# Patient Record
Sex: Female | Born: 1960 | Race: Black or African American | Hispanic: No | Marital: Single | Smoking: Never smoker
Health system: Southern US, Community
[De-identification: ages and names within clinical notes are randomized; demographics above are authoritative.]

## PROBLEM LIST (undated history)

## (undated) DIAGNOSIS — N289 Disorder of kidney and ureter, unspecified: Secondary | ICD-10-CM

---

## 2000-12-25 ENCOUNTER — Emergency Department (HOSPITAL_COMMUNITY): Admission: EM | Admit: 2000-12-25 | Discharge: 2000-12-25 | Payer: Self-pay

## 2001-11-25 ENCOUNTER — Emergency Department (HOSPITAL_COMMUNITY): Admission: EM | Admit: 2001-11-25 | Discharge: 2001-11-25 | Payer: Self-pay | Admitting: Emergency Medicine

## 2001-12-01 ENCOUNTER — Emergency Department (HOSPITAL_COMMUNITY): Admission: EM | Admit: 2001-12-01 | Discharge: 2001-12-01 | Payer: Self-pay | Admitting: Emergency Medicine

## 2001-12-02 ENCOUNTER — Encounter: Payer: Self-pay | Admitting: Emergency Medicine

## 2002-04-18 ENCOUNTER — Emergency Department (HOSPITAL_COMMUNITY): Admission: EM | Admit: 2002-04-18 | Discharge: 2002-04-18 | Payer: Self-pay | Admitting: Emergency Medicine

## 2002-11-22 ENCOUNTER — Encounter: Payer: Self-pay | Admitting: Emergency Medicine

## 2002-11-22 ENCOUNTER — Emergency Department (HOSPITAL_COMMUNITY): Admission: EM | Admit: 2002-11-22 | Discharge: 2002-11-22 | Payer: Self-pay | Admitting: *Deleted

## 2003-08-25 ENCOUNTER — Emergency Department (HOSPITAL_COMMUNITY): Admission: EM | Admit: 2003-08-25 | Discharge: 2003-08-26 | Payer: Self-pay | Admitting: Emergency Medicine

## 2008-11-26 ENCOUNTER — Ambulatory Visit (HOSPITAL_COMMUNITY): Admission: EM | Admit: 2008-11-26 | Discharge: 2008-11-26 | Payer: Self-pay | Admitting: Emergency Medicine

## 2009-01-18 ENCOUNTER — Emergency Department (HOSPITAL_COMMUNITY): Admission: EM | Admit: 2009-01-18 | Discharge: 2009-01-19 | Payer: Self-pay | Admitting: *Deleted

## 2010-11-26 LAB — COMPREHENSIVE METABOLIC PANEL
AST: 26 U/L (ref 0–37)
Albumin: 3.7 g/dL (ref 3.5–5.2)
Alkaline Phosphatase: 71 U/L (ref 39–117)
BUN: 7 mg/dL (ref 6–23)
Chloride: 103 mEq/L (ref 96–112)
Creatinine, Ser: 0.65 mg/dL (ref 0.4–1.2)
GFR calc Af Amer: >60 mL/min (ref >60)
Potassium: 3.5 mEq/L (ref 3.5–5.1)
Total Bilirubin: 1 mg/dL (ref 0.3–1.2)
Total Protein: 7.2 g/dL (ref 6.0–8.3)

## 2010-11-26 LAB — DIFFERENTIAL
Basophils Absolute: 0 10*3/uL (ref 0.0–0.1)
Eosinophils Relative: 1 % (ref 0–5)
Lymphocytes Relative: 5 % — ABNORMAL LOW (ref 12–46)
Monocytes Absolute: 0.4 10*3/uL (ref 0.1–1.0)
Monocytes Relative: 5 % (ref 3–12)
Neutro Abs: 7.3 10*3/uL (ref 1.7–7.7)

## 2010-11-26 LAB — URINALYSIS, ROUTINE W REFLEX MICROSCOPIC
Leukocytes, UA: NEGATIVE
Nitrite: NEGATIVE
Specific Gravity, Urine: 1.023 (ref 1.005–1.030)
Urobilinogen, UA: 1 mg/dL (ref 0.0–1.0)
pH: 6 (ref 5.0–8.0)

## 2010-11-26 LAB — CBC
MCHC: 33.6 g/dL (ref 30.0–36.0)
MCV: 88.3 fL (ref 78.0–100.0)
RBC: 4.35 MIL/uL (ref 3.87–5.11)
RDW: 12.1 % (ref 11.5–15.5)

## 2010-11-26 LAB — URINE MICROSCOPIC-ADD ON

## 2010-11-28 LAB — DIFFERENTIAL
Basophils Absolute: 0.1 10*3/uL (ref 0.0–0.1)
Basophils Relative: 1 % (ref 0–1)
Lymphocytes Relative: 28 % (ref 12–46)
Monocytes Absolute: 0.6 10*3/uL (ref 0.1–1.0)
Neutro Abs: 5.9 10*3/uL (ref 1.7–7.7)
Neutrophils Relative %: 64 % (ref 43–77)

## 2010-11-28 LAB — CBC
HCT: 36.8 % (ref 36.0–46.0)
MCHC: 33.3 g/dL (ref 30.0–36.0)
MCV: 87.2 fL (ref 78.0–100.0)
Platelets: 267 10*3/uL (ref 150–400)
RBC: 4.22 MIL/uL (ref 3.87–5.11)
RDW: 11.7 % (ref 11.5–15.5)
WBC: 8.9 10*3/uL (ref 4.0–10.5)

## 2010-11-28 LAB — URINALYSIS, ROUTINE W REFLEX MICROSCOPIC
Bilirubin Urine: NEGATIVE
Glucose, UA: NEGATIVE mg/dL
Hgb urine dipstick: NEGATIVE
Specific Gravity, Urine: 1.015 (ref 1.005–1.030)

## 2010-11-28 LAB — BASIC METABOLIC PANEL
BUN: 5 mg/dL — ABNORMAL LOW (ref 6–23)
CO2: 25 mEq/L (ref 19–32)
Calcium: 8.9 mg/dL (ref 8.4–10.5)
Chloride: 105 mEq/L (ref 96–112)
Creatinine, Ser: 0.51 mg/dL (ref 0.4–1.2)

## 2010-11-28 LAB — LIPID PANEL
Total CHOL/HDL Ratio: 3.8 RATIO
VLDL: 11 mg/dL (ref 0–40)

## 2010-11-28 LAB — RAPID URINE DRUG SCREEN, HOSP PERFORMED
Barbiturates: NOT DETECTED
Benzodiazepines: NOT DETECTED
Cocaine: NOT DETECTED
Opiates: NOT DETECTED

## 2010-11-28 LAB — POCT CARDIAC MARKERS
CKMB, poc: <1 ng/mL — ABNORMAL LOW (ref 1.0–8.0)
Myoglobin, poc: 50.4 ng/mL (ref 12–200)
Troponin i, poc: <0.05 ng/mL (ref 0.00–0.09)

## 2010-11-28 LAB — D-DIMER, QUANTITATIVE: D-Dimer, Quant: 0.29 ug/mL-FEU (ref 0.00–0.48)

## 2010-11-28 LAB — URINE CULTURE
Colony Count: NO GROWTH
Culture: NO GROWTH

## 2010-11-28 LAB — POCT I-STAT, CHEM 8
BUN: 7 mg/dL (ref 6–23)
Chloride: 104 mEq/L (ref 96–112)
Glucose, Bld: 96 mg/dL (ref 70–99)
HCT: 43 % (ref 36.0–46.0)
Potassium: 3.8 mEq/L (ref 3.5–5.1)

## 2010-11-28 LAB — PROTIME-INR
INR: 1 (ref 0.00–1.49)
Prothrombin Time: 13.2 seconds (ref 11.6–15.2)

## 2010-11-28 LAB — TSH: TSH: 2.23 u[IU]/mL (ref 0.350–4.500)

## 2010-11-28 LAB — GLUCOSE, CAPILLARY

## 2010-11-28 LAB — POCT PREGNANCY, URINE: Preg Test, Ur: NEGATIVE

## 2010-11-28 LAB — APTT: aPTT: 34 seconds (ref 24–37)

## 2010-11-28 LAB — CARDIAC PANEL(CRET KIN+CKTOT+MB+TROPI): CK, MB: 0.7 ng/mL (ref 0.3–4.0)

## 2011-01-01 NOTE — H&P (Signed)
NAMEASENETH, HACK               ACCOUNT NO.:  [id number]   MEDICAL RECORD NO.:  [id number]          PATIENT TYPE:  INP   LOCATION:  1403                         FACILITY:  [facility]   PHYSICIAN:  Sarajane Marek, MD     DATE OF BIRTH:  March 17, 1961   DATE OF ADMISSION:  11/25/2008  DATE OF DISCHARGE:  11/26/2008                              HISTORY & PHYSICAL   CHIEF COMPLAINT:  Chest pain.   Ms. Wallman is a 50 year old African American female with no significant  past medical history and no cardiovascular risk factors who presents  with chest pain.  She reports she has had chest tightness with the sense  of not being able to get a full breath for 24 hours.  This started  yesterday while she was running and caused her to have to stop  exercising secondary to chest tightness.  This has waxed and waned  throughout the last 24 hours waking her last night from sleep with the  sense that she could not get a full breath.  Again came on today while  walking to a class and then tonight while lying down, she had a sense of  fullness that improved with sitting up.  She does not associate this  specifically with exertion and has not found anything that has relieved  it, but notes that it waxes and wanes on its own.  It does not radiate,  though she has some epigastric discomfort with it.  She has had some  nausea with no diaphoresis.  She has not had any coughing or wheezing.  She denies presyncope or syncope.  She has not had any orthopnea, PND,  or lower extremity edema.  She has no cardiovascular history and has  never had a cardiovascular evaluation.  She is concerned that this is  related to either her heart or lung disorder.  She has no history of  asthma and he has never had PFTs or other lung evaluation.   PAST MEDICAL HISTORY:  History of recurrent UTIs as a child.  No history  as an adult.   MEDICATIONS:  None.   ALLERGIES:  None.   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  She  denies alcohol, tobacco, or illicit drug use.  She does take  supplements, Prevagen and Biotin but does not use any energy supplements  or weight loss pills and has no history of this.   FAMILY HISTORY:  Her father is deceased in his 39s with colon cancer.  Her mother is alive with hypercholesterolemia.   REVIEW OF SYSTEMS:  Complete review of systems is performed and is  negative except as for HPI.   PHYSICAL EXAMINATION:  VITAL SIGNS:  Blood pressure is 122/60, pulse is  70-80, respiratory rate is 16, oxygen saturation is 100% on room air,  and temperature is 97.5.  GENERAL:  This is a very pleasant middle aged [place] American female,  in no acute distress.  EYES:  Extraocular muscles are  intact.  Sclerae are clear.  ENT:  Oropharynx is clear.  Nares are clear Mucous membranes are moist.  NECK:  Supple without lymphadenopathy.  No thyromegaly, bruits, or JVD.  CARDIOVASCULAR:  Heart rate is regular.  No S1 and S2.  No S3 and S4.  No murmur, gallop, or rub.  There is no elevation of jugular venous  pulse.  Carotid upstroke is normal.  She has no lower extremity edema  and peripheral pulses are 2+ x4 extremities.  LUNGS:  Clear to auscultation bilaterally without wheezes, rhonchi, or  rales.  No increased work of breathing or tachypnea.  ABDOMEN:  Soft and nontender with normoactive bowel sounds.  No rebound,  guarding, or mass.  EXTREMITIES:  No clubbing, cyanosis, or edema.  NEUROLOGIC:  Alert and oriented x3.  Cranial nerves II through XII  grossly intact.  No focal deficits.   EKG is normal with normal sinus rhythm at a rate of 73.   LABORATORY DATA:  Urine pregnancy test is negative.  Urinalysis is  unremarkable.  D-dimer is 0.29, negative.  CK-MB is less than 1.  Troponin is less than 0.05.  Myoglobin is 32.  Sodium is 139, potassium  3.8, chloride is 104, glucose is 96,  creatinine is 0.7, bicarb is 25.  Hemoglobin is 13.7, hematocrit is 41, and platelets are 267.  Her white  count is 9.3.  Chest x-ray demonstrates no acute cardiopulmonary  process.   ASSESSMENT AND PLAN:  Ms. Norment is a 50 year old African American  female with no cardiovascular risk factors, presenting with chest  tightness x24 hours.  1. Chest tightness:  The patient will be admitted to outpatient      observation for evaluation for acute myocardial infarction with      serial cardiac biomarkers, telemetry, and serial EKG.  She has been      given aspirin 325 mg in the emergency department which we will      continue daily.  She was also offered Morphine and sublingual      nitroglycerin which she declines despite ongoing chest tightness      which she rates as 3/10 at this time.  She understands the risk of      refusing further medical therapy and prefers to wait for pending      blood work given normal blood work and normal EKG at this point in      time.  We will also obtain an echocardiogram in the morning to      evaluate for LV function, valvular disorder, diastolic dysfunction,      or any other anomalies given her history of shortness of breath and      improvement with sitting up as well as chest tightness.  We would      also consider that this is a pulmonary process and pending cardiac      evaluation, we will consider PFTs.  After we obtained serial      cardiac biomarkers could consider exercise stress testing to      include an imaging portion.  We will check a TSH as well as a BMP      and fasting lipid panel tonight.  In addition, we will obtain a      urine drug screen.  2. Prophylaxis:  The patient is ambulatory.  There is no indication      for GI prophylaxis.   DISPOSITION:  The patient is full code.  She will need to be  set up with  the primary care physician for followup after this, as she is currently  unassigned.      Sarajane Marek, MD   Electronically Signed     HH/MEDQ  D:  11/26/2008  T:  11/27/2008  Job:  [id number]

## 2011-07-28 ENCOUNTER — Emergency Department (HOSPITAL_COMMUNITY): Payer: Self-pay

## 2011-07-28 ENCOUNTER — Emergency Department (HOSPITAL_COMMUNITY)
Admission: EM | Admit: 2011-07-28 | Discharge: 2011-07-29 | Disposition: A | Discharge status: Home / Self Care | Payer: Self-pay | Attending: Emergency Medicine | Admitting: Emergency Medicine

## 2011-07-28 DIAGNOSIS — T148XXA Other injury of unspecified body region, initial encounter: Secondary | ICD-10-CM | POA: Insufficient documentation

## 2011-07-28 DIAGNOSIS — R109 Unspecified abdominal pain: Secondary | ICD-10-CM | POA: Insufficient documentation

## 2011-07-28 DIAGNOSIS — M549 Dorsalgia, unspecified: Secondary | ICD-10-CM | POA: Insufficient documentation

## 2011-07-28 DIAGNOSIS — M79609 Pain in unspecified limb: Secondary | ICD-10-CM | POA: Insufficient documentation

## 2011-07-28 HISTORY — DX: Disorder of kidney and ureter, unspecified: N28.9

## 2011-07-28 MED ORDER — OXYCODONE-ACETAMINOPHEN 5-325 MG PO TABS: 2.0000 | ORAL_TABLET | ORAL | Status: AC | PRN

## 2011-07-28 MED ORDER — HYDROMORPHONE HCL PF 1 MG/ML IJ SOLN
1.0000 mg | Freq: Once | INTRAMUSCULAR | Status: AC
  Administered 2011-07-28: 1 mg via INTRAMUSCULAR
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
## 2011-07-29 LAB — URINALYSIS, ROUTINE W REFLEX MICROSCOPIC
Glucose, UA: NEGATIVE mg/dL
Ketones, ur: 15 mg/dL — AB
Leukocytes, UA: NEGATIVE
Specific Gravity, Urine: 1.026 (ref 1.005–1.030)
pH: 5.5 (ref 5.0–8.0)

## 2011-07-29 LAB — URINE MICROSCOPIC-ADD ON

## 2011-07-29 NOTE — ED Notes (Signed)
Patient now wake from pain medication /  D/c instructions given

## 2011-07-29 NOTE — ED Provider Notes (Signed)
Results for orders placed during the hospital encounter of 07/28/11  URINALYSIS, ROUTINE W REFLEX MICROSCOPIC      Component Value Range   Color, Urine YELLOW  YELLOW    APPearance CLOUDY (*) CLEAR    Specific Gravity, Urine 1.026  1.005 - 1.030    pH 5.5  5.0 - 8.0    Glucose, UA NEGATIVE  NEGATIVE (mg/dL)   Hgb urine dipstick NEGATIVE  NEGATIVE    Bilirubin Urine SMALL (*) NEGATIVE    Ketones, ur 15 (*) NEGATIVE (mg/dL)   Protein, ur 409 (*) NEGATIVE (mg/dL)   Urobilinogen, UA 0.2  0.0 - 1.0 (mg/dL)   Nitrite NEGATIVE  NEGATIVE    Leukocytes, UA NEGATIVE  NEGATIVE   URINE MICROSCOPIC-ADD ON      Component Value Range   Squamous Epithelial / LPF RARE  RARE    WBC, UA 0-2  <3 (WBC/hpf)   RBC / HPF 0-2  <3 (RBC/hpf)   Bacteria, UA MANY (*) RARE    Casts HYALINE CASTS (*) NEGATIVE    Urine-Other MUCOUS PRESENT       Hanley Seamen, MD 07/29/11 [phone]

## 2011-12-03 ENCOUNTER — Ambulatory Visit (INDEPENDENT_AMBULATORY_CARE_PROVIDER_SITE_OTHER): Payer: BC Managed Care – PPO | Admitting: Family Medicine

## 2011-12-03 VITALS — BP 110/74 | HR 63 | Temp 99.0°F | Resp 16 | Ht 64.0 in | Wt 128.0 lb

## 2011-12-03 DIAGNOSIS — L309 Dermatitis, unspecified: Secondary | ICD-10-CM

## 2011-12-03 DIAGNOSIS — L259 Unspecified contact dermatitis, unspecified cause: Secondary | ICD-10-CM

## 2011-12-03 DIAGNOSIS — R21 Rash and other nonspecific skin eruption: Secondary | ICD-10-CM

## 2011-12-03 LAB — POCT SKIN KOH: Skin KOH, POC: NEGATIVE

## 2011-12-03 MED ORDER — CLOBETASOL PROPIONATE 0.05 % EX CREA: TOPICAL_CREAM | Freq: Two times a day (BID) | CUTANEOUS | Status: DC

## 2011-12-03 NOTE — Patient Instructions (Signed)

## 2011-12-03 NOTE — Progress Notes (Signed)
Subjective lung :Patient has a rash at the back of her head and adjacent to the web space of the first and second hands. Does not know of any contact allergens. It started first in the right handouts on the left hand also. She used some OTC hydrocortisone cream, but that didn't help. He is intensely. It's been there for several weeks. This started when she got this job that she is in, at [employer].  Objective Eczematoid appearing dermatitis about the size of a quarter on the back of both hands in the webs behind the web space of the thumb and first and index finger. A tiny little vesicles. Skin scraping was done and was negative.  Assessment eczema  Plan: Clobetasol twice a day

## 2012-01-21 ENCOUNTER — Ambulatory Visit (INDEPENDENT_AMBULATORY_CARE_PROVIDER_SITE_OTHER): Payer: BC Managed Care – PPO | Admitting: Family Medicine

## 2012-01-21 VITALS — BP 124/74 | HR 74 | Temp 97.9°F | Resp 16 | Ht 65.0 in | Wt 133.0 lb

## 2012-01-21 DIAGNOSIS — R3915 Urgency of urination: Secondary | ICD-10-CM

## 2012-01-21 LAB — POCT URINALYSIS DIPSTICK
Bilirubin, UA: NEGATIVE
Glucose, UA: NEGATIVE
Ketones, UA: NEGATIVE
Nitrite, UA: NEGATIVE
Protein, UA: 100
Spec Grav, UA: 1.02
Urobilinogen, UA: 0.2
pH, UA: 5.5

## 2012-01-21 LAB — POCT UA - MICROSCOPIC ONLY
Casts, Ur, LPF, POC: NEGATIVE
Crystals, Ur, HPF, POC: NEGATIVE
Mucus, UA: POSITIVE
Yeast, UA: NEGATIVE

## 2012-01-21 MED ORDER — CIPROFLOXACIN HCL 500 MG PO TABS: 500.0000 mg | ORAL_TABLET | Freq: Two times a day (BID) | ORAL | Status: AC

## 2012-01-21 NOTE — Progress Notes (Signed)
  Subjective:    Patient ID: Ashley Kline, female    DOB: 05-11-1961, 51 y.o.   MRN: [id number]  HPI Patient presents complaining of dysuria, urgency and frequency.  Denies fever Chills and nausea   Review of Systems     Objective:   Physical Exam  Constitutional: She appears well-developed.  HENT:  Mouth/Throat: Oropharynx is clear and moist.  Neck: Neck supple.  Cardiovascular: Normal rate, regular rhythm and normal heart sounds.   Pulmonary/Chest: Effort normal and breath sounds normal.  Abdominal: Soft. She exhibits no mass. There is Tenderness: suprapubic.Marland Kitchen       No CVA tenderness  Neurological: She is alert.  Skin: Skin is warm.     Results for orders placed in visit on 01/21/12  POCT URINALYSIS DIPSTICK      Component Value Range   Color, UA yellow     Clarity, UA cloudy     Glucose, UA neg     Bilirubin, UA neg     Ketones, UA neg     Spec Grav, UA 1.020     Blood, UA small     pH, UA 5.5     Protein, UA 100     Urobilinogen, UA 0.2     Nitrite, UA neg     Leukocytes, UA moderate (2+)    POCT UA - MICROSCOPIC ONLY      Component Value Range   WBC, Ur, HPF, POC tntc     RBC, urine, microscopic tntc     Bacteria, U Microscopic 3+     Mucus, UA pos     Epithelial cells, urine per micros 0-2     Crystals, Ur, HPF, POC neg     Casts, Ur, LPF, POC neg     Yeast, UA neg    POCT URINE PREGNANCY      Component Value Range   Preg Test, Ur Negative           Assessment & Plan:   1. UTI  POCT urinalysis dipstick, POCT UA - Microscopic Only, POCT urine pregnancy, ciprofloxacin (CIPRO) 500 MG tablet

## 2012-08-07 ENCOUNTER — Ambulatory Visit (INDEPENDENT_AMBULATORY_CARE_PROVIDER_SITE_OTHER): Payer: BC Managed Care – PPO | Admitting: Physician Assistant

## 2012-08-07 VITALS — BP 121/81 | HR 65 | Temp 97.7°F | Resp 16 | Ht 65.0 in | Wt 152.4 lb

## 2012-08-07 DIAGNOSIS — N760 Acute vaginitis: Secondary | ICD-10-CM

## 2012-08-07 DIAGNOSIS — N898 Other specified noninflammatory disorders of vagina: Secondary | ICD-10-CM

## 2012-08-07 DIAGNOSIS — N899 Noninflammatory disorder of vagina, unspecified: Secondary | ICD-10-CM

## 2012-08-07 LAB — POCT WET PREP WITH KOH
Clue Cells Wet Prep HPF POC: POSITIVE
RBC Wet Prep HPF POC: NEGATIVE
Yeast Wet Prep HPF POC: NEGATIVE

## 2012-08-07 MED ORDER — METRONIDAZOLE 500 MG PO TABS: 500.0000 mg | ORAL_TABLET | Freq: Two times a day (BID) | ORAL | Status: DC

## 2012-08-07 MED ORDER — METRONIDAZOLE 0.75 % VA GEL: 1.0000 | Freq: Every day | VAGINAL | Status: DC

## 2012-08-07 NOTE — Addendum Note (Signed)
Addended by: Morrell Riddle on: 08/07/2012 03:07 PM   Modules accepted: Orders

## 2012-08-07 NOTE — Progress Notes (Signed)
   [address] [place] [address]   Phone [phone]  Subjective:    Patient ID: Ashley Kline, female    DOB: 30-Jan-1961, 51 y.o.   MRN: [id number]  HPI  Pt presents to clinic with vaginal irritation 1 day last week after her menses ended.  She used a new type of tampon that did not have a string and is not completely sure she took it out.  She currently has no vaginal discharge or irritation but would like to be checked because she will worry.  She is sexually active without any change in partners.  No urinary symptoms.  Review of Systems  Constitutional: Negative for fever and chills.  Gastrointestinal: Negative for abdominal pain.  Genitourinary: Negative for dysuria, frequency and vaginal discharge.       Objective:   Physical Exam  Vitals reviewed. Constitutional: She is oriented to person, place, and time. She appears well-developed and well-nourished.  HENT:  Head: Normocephalic and atraumatic.  Right Ear: External ear normal.  Left Ear: External ear normal.  Eyes: Conjunctivae normal are normal.  Neck: Neck supple.  Cardiovascular: Normal rate, regular rhythm and normal heart sounds.   Pulmonary/Chest: Effort normal and breath sounds normal.  Genitourinary: Pelvic exam was performed with patient supine. No labial fusion. There is no rash, tenderness, lesion or injury on the right labia. There is no rash, tenderness, lesion or injury on the left labia. Uterus is enlarged. Cervix exhibits no motion tenderness, no discharge and no friability. Right adnexum displays no mass, no tenderness and no fullness. Left adnexum displays no mass, no tenderness and no fullness. Vaginal discharge (small amount of thin white discharge) found.       Uterus is hard - probably fibroid/s  Neurological: She is alert and oriented to person, place, and time.  Skin: Skin is warm and dry.  Psychiatric: She has a normal mood and affect. Her behavior is normal. Judgment and thought content normal.     Results for orders placed in visit on 08/07/12  POCT WET PREP WITH KOH      Component Value Range   Trichomonas, UA Negative     Clue Cells Wet Prep HPF POC positive     Epithelial Wet Prep HPF POC 3-5     Yeast Wet Prep HPF POC negative     Bacteria Wet Prep HPF POC large     RBC Wet Prep HPF POC negative     WBC Wet Prep HPF POC 3-5     KOH Prep POC Negative            Assessment & Plan:   1. Vaginal irritation  GC/Chlamydia Probe Amp, POCT Wet Prep with KOH  2. BV (bacterial vaginosis)  metroNIDAZOLE (METROGEL VAGINAL) 0.75 % vaginal gel   D/w pt her diagnosis.  Answered patient's question.  At this time patient is not having trouble with her menses but d/w pt that if her menses gets heavy and increased cramping she may need gyn evaluate due to possible fiboids.

## 2012-08-08 LAB — GC/CHLAMYDIA PROBE AMP: CT Probe RNA: NEGATIVE

## 2012-11-20 ENCOUNTER — Encounter (HOSPITAL_COMMUNITY): Payer: Self-pay | Admitting: *Deleted

## 2012-11-20 ENCOUNTER — Emergency Department (HOSPITAL_COMMUNITY)
Admission: EM | Admit: 2012-11-20 | Discharge: 2012-11-20 | Disposition: A | Discharge status: Home / Self Care | Payer: Self-pay | Attending: Emergency Medicine | Admitting: Emergency Medicine

## 2012-11-20 ENCOUNTER — Emergency Department (HOSPITAL_COMMUNITY): Payer: Self-pay

## 2012-11-20 DIAGNOSIS — R072 Precordial pain: Secondary | ICD-10-CM | POA: Insufficient documentation

## 2012-11-20 DIAGNOSIS — Z87448 Personal history of other diseases of urinary system: Secondary | ICD-10-CM | POA: Insufficient documentation

## 2012-11-20 DIAGNOSIS — K209 Esophagitis, unspecified without bleeding: Secondary | ICD-10-CM | POA: Insufficient documentation

## 2012-11-20 DIAGNOSIS — R079 Chest pain, unspecified: Secondary | ICD-10-CM

## 2012-11-20 LAB — URINALYSIS, ROUTINE W REFLEX MICROSCOPIC
Leukocytes, UA: NEGATIVE
Protein, ur: NEGATIVE mg/dL
Specific Gravity, Urine: 1.009 (ref 1.005–1.030)
Urobilinogen, UA: 0.2 mg/dL (ref 0.0–1.0)

## 2012-11-20 LAB — CBC WITH DIFFERENTIAL/PLATELET
Basophils Relative: 0 % (ref 0–1)
Eosinophils Absolute: 0.1 10*3/uL (ref 0.0–0.7)
Eosinophils Relative: 2 % (ref 0–5)
Lymphs Abs: 2.4 10*3/uL (ref 0.7–4.0)
MCH: 28.8 pg (ref 26.0–34.0)
MCHC: 34 g/dL (ref 30.0–36.0)
MCV: 84.7 fL (ref 78.0–100.0)
Neutrophils Relative %: 45 % (ref 43–77)
Platelets: 310 10*3/uL (ref 150–400)
RDW: 11.8 % (ref 11.5–15.5)

## 2012-11-20 LAB — COMPREHENSIVE METABOLIC PANEL
ALT: 22 U/L (ref 0–35)
Albumin: 3.9 g/dL (ref 3.5–5.2)
Alkaline Phosphatase: 72 U/L (ref 39–117)
BUN: 10 mg/dL (ref 6–23)
Calcium: 9.4 mg/dL (ref 8.4–10.5)
GFR calc Af Amer: >90 mL/min (ref >90)
Glucose, Bld: 95 mg/dL (ref 70–99)
Potassium: 4 mEq/L (ref 3.5–5.1)
Sodium: 133 mEq/L — ABNORMAL LOW (ref 135–145)
Total Protein: 7.7 g/dL (ref 6.0–8.3)

## 2012-11-20 LAB — POCT I-STAT TROPONIN I: Troponin i, poc: 0 ng/mL (ref 0.00–0.08)

## 2012-11-20 MED ORDER — FAMOTIDINE 20 MG PO TABS
20.0000 mg | ORAL_TABLET | Freq: Once | ORAL | Status: AC
  Administered 2012-11-20: 20 mg via ORAL
  Filled 2012-11-20: qty 1

## 2012-11-20 MED ORDER — GI COCKTAIL ~~LOC~~
30.0000 mL | Freq: Once | ORAL | Status: DC
  Filled 2012-11-20: qty 30

## 2012-11-20 MED ORDER — HYDROCODONE-ACETAMINOPHEN 5-325 MG PO TABS
1.0000 | ORAL_TABLET | Freq: Once | ORAL | Status: DC
  Filled 2012-11-20: qty 1

## 2012-11-20 NOTE — ED Provider Notes (Signed)
History     CSN: [id number]  Arrival date & time 11/20/12  0025   First MD Initiated Contact with Patient 11/20/12 0122      Chief Complaint  Patient presents with  . Chest Pain    (Consider location/radiation/quality/duration/timing/severity/associated sxs/prior treatment) Patient is a 52 y.o. female presenting with chest pain. The history is provided by the patient.  Chest Pain Associated symptoms: no abdominal pain, no back pain, no cough, no diaphoresis, no fever, no headache, no palpitations, no shortness of breath and not vomiting   pt states for past day, has had pain in midline lower sternal/xiphoid area. Dull, pt ?whether gas earlier. States pain at rest. No relation to exertion or activity. Is worse when lying flat. Denies hx gerd. Pain does not radiating. No back, neck or shoulder pain. No associated nv, diaphoresis or sob. Pain is not pleuritic. Denies cough or uri c/o. No fever or chills. No personal or family hx cad. Non smoker. No hx htn, dm or high chol. No recent immobility, trauma, surgery, travel or hx dvt or pe. No chest wall injury or strain.   Past Medical History  Diagnosis Date  . Renal disorder     History reviewed. No pertinent past surgical history.  Family History  Problem Relation Age of Onset  . Cancer Father     History  Substance Use Topics  . Smoking status: Never Smoker   . Smokeless tobacco: Not on file  . Alcohol Use: No    OB History   Grav Para Term Preterm Abortions TAB SAB Ect Mult Living                  Review of Systems  Constitutional: Negative for fever, chills and diaphoresis.  HENT: Negative for neck pain.   Eyes: Negative for redness.  Respiratory: Negative for cough and shortness of breath.   Cardiovascular: Positive for chest pain. Negative for palpitations and leg swelling.  Gastrointestinal: Negative for vomiting and abdominal pain.  Genitourinary: Negative for flank pain.  Musculoskeletal: Negative for back pain.   Skin: Negative for rash.  Neurological: Negative for headaches.  Hematological: Does not bruise/bleed easily.  Psychiatric/Behavioral: Negative for confusion.    Allergies  Review of patient's allergies indicates no known allergies.  Home Medications   Current Outpatient Rx  Name  Route  Sig  Dispense  Refill  . naproxen sodium (ANAPROX) 220 MG tablet   Oral   Take 220 mg by mouth 2 (two) times daily as needed (for headache).           BP 114/74  Pulse 63  Temp(Src) 97.4 F (36.3 C)  Resp 16  SpO2 98%  LMP 09/22/2012  Physical Exam  Nursing note and vitals reviewed. Constitutional: She appears well-developed and well-nourished. No distress.  HENT:  Mouth/Throat: Oropharynx is clear and moist.  Eyes: Conjunctivae are normal. No scleral icterus.  Neck: Neck supple. No tracheal deviation present.  Cardiovascular: Normal rate, regular rhythm, normal heart sounds and intact distal pulses.  Exam reveals no gallop and no friction rub.   No murmur heard. Pulmonary/Chest: Effort normal and breath sounds normal. No respiratory distress. She exhibits no tenderness.  Abdominal: Soft. Normal appearance. She exhibits no distension and no mass. There is no tenderness. There is no rebound and no guarding.  Musculoskeletal: She exhibits no edema and no tenderness.  Neurological: She is alert.  Skin: Skin is warm and dry. No rash noted.  Psychiatric: She has a normal mood  and affect.    ED Course  Procedures (including critical care time)   Results for orders placed during the hospital encounter of 11/20/12  CBC WITH DIFFERENTIAL      Result Value Range   WBC 5.4  4.0 - 10.5 K/uL   RBC 4.38  3.87 - 5.11 MIL/uL   Hemoglobin 12.6  12.0 - 15.0 g/dL   HCT 16.1  09.6 - 04.5 %   MCV 84.7  78.0 - 100.0 fL   MCH 28.8  26.0 - 34.0 pg   MCHC 34.0  30.0 - 36.0 g/dL   RDW 40.9  81.1 - 91.4 %   Platelets 310  150 - 400 K/uL   Neutrophils Relative 45  43 - 77 %   Neutro Abs 2.4  1.7  - 7.7 K/uL   Lymphocytes Relative 44  12 - 46 %   Lymphs Abs 2.4  0.7 - 4.0 K/uL   Monocytes Relative 9  3 - 12 %   Monocytes Absolute 0.5  0.1 - 1.0 K/uL   Eosinophils Relative 2  0 - 5 %   Eosinophils Absolute 0.1  0.0 - 0.7 K/uL   Basophils Relative 0  0 - 1 %   Basophils Absolute 0.0  0.0 - 0.1 K/uL  COMPREHENSIVE METABOLIC PANEL      Result Value Range   Sodium 133 (*) 135 - 145 mEq/L   Potassium 4.0  3.5 - 5.1 mEq/L   Chloride 99  96 - 112 mEq/L   CO2 24  19 - 32 mEq/L   Glucose, Bld 95  70 - 99 mg/dL   BUN 10  6 - 23 mg/dL   Creatinine, Ser 7.82  0.50 - 1.10 mg/dL   Calcium 9.4  8.4 - 95.6 mg/dL   Total Protein 7.7  6.0 - 8.3 g/dL   Albumin 3.9  3.5 - 5.2 g/dL   AST 31  0 - 37 U/L   ALT 22  0 - 35 U/L   Alkaline Phosphatase 72  39 - 117 U/L   Total Bilirubin 0.3  0.3 - 1.2 mg/dL   GFR calc non Af Amer >90  >90 mL/min   GFR calc Af Amer >90  >90 mL/min  URINALYSIS, ROUTINE W REFLEX MICROSCOPIC      Result Value Range   Color, Urine YELLOW  YELLOW   APPearance CLEAR  CLEAR   Specific Gravity, Urine 1.009  1.005 - 1.030   pH 6.5  5.0 - 8.0   Glucose, UA NEGATIVE  NEGATIVE mg/dL   Hgb urine dipstick NEGATIVE  NEGATIVE   Bilirubin Urine NEGATIVE  NEGATIVE   Ketones, ur NEGATIVE  NEGATIVE mg/dL   Protein, ur NEGATIVE  NEGATIVE mg/dL   Urobilinogen, UA 0.2  0.0 - 1.0 mg/dL   Nitrite NEGATIVE  NEGATIVE   Leukocytes, UA NEGATIVE  NEGATIVE  POCT I-STAT TROPONIN I      Result Value Range   Troponin i, poc 0.00  0.00 - 0.08 ng/mL   Comment 3            Dg Chest 2 View  11/20/2012  *RADIOLOGY REPORT*  Clinical Data: Chest pain for 1 day.  CHEST - 2 VIEW  Comparison: 11/25/2008  Findings: Slightly shallow inspiration. The heart size and pulmonary vascularity are normal. The lungs appear clear and expanded without focal air space disease or consolidation. No blunting of the costophrenic angles.  No pneumothorax.  Mediastinal contours appear intact appear intact.  No significant  change since previous study.  IMPRESSION: No evidence of active pulmonary disease.   Original Report Authenticated By: Burman Nieves, M.D.        MDM  Labs. Ecg. Cxr.  pepcid and gi cocktail po.  Reviewed nursing notes and prior charts for additional history.    Date: 11/20/2012  Rate: 83  Rhythm: normal sinus rhythm  QRS Axis: normal  Intervals: normal  ST/T Wave abnormalities: nonspecific ST/T changes  Conduction Disutrbances:none  Narrative Interpretation:   Old EKG Reviewed: changes noted  Relief w gi med.  Trop 0, cxr neg.   Pt appears stable for d/c.         Suzi Roots, MD 11/20/12 [phone]

## 2012-11-20 NOTE — ED Notes (Signed)
Pt states today around 2pm felt fullness and heavy; "llike someone sitting on it"; went home and laid down; got better; when she got up the feeling would return every 3 hours; got up around 11pm and started doing stuff in the kitchen and the chest fullness returned; headache

## 2012-11-20 NOTE — ED Notes (Signed)
Pt states she does not want to take the other medications at this time as her pain is only a 2/10.  Pt states "if my pain comes back, then maybe we will revisit the other medications."

## 2013-02-17 ENCOUNTER — Ambulatory Visit (INDEPENDENT_AMBULATORY_CARE_PROVIDER_SITE_OTHER): Payer: Self-pay | Admitting: Physician Assistant

## 2013-02-17 VITALS — BP 110/64 | HR 72 | Temp 97.6°F | Resp 16 | Ht 64.0 in | Wt 164.0 lb

## 2013-02-17 DIAGNOSIS — B9689 Other specified bacterial agents as the cause of diseases classified elsewhere: Secondary | ICD-10-CM

## 2013-02-17 DIAGNOSIS — N898 Other specified noninflammatory disorders of vagina: Secondary | ICD-10-CM

## 2013-02-17 DIAGNOSIS — N760 Acute vaginitis: Secondary | ICD-10-CM

## 2013-02-17 LAB — POCT URINALYSIS DIPSTICK
Leukocytes, UA: NEGATIVE
Nitrite, UA: NEGATIVE
Protein, UA: NEGATIVE
Urobilinogen, UA: 0.2
pH, UA: 5.5

## 2013-02-17 LAB — POCT WET PREP WITH KOH
KOH Prep POC: NEGATIVE
Trichomonas, UA: NEGATIVE

## 2013-02-17 MED ORDER — METRONIDAZOLE 500 MG PO TABS: 500.0000 mg | ORAL_TABLET | Freq: Two times a day (BID) | ORAL | Status: AC

## 2013-02-17 NOTE — Patient Instructions (Addendum)
Bacterial Vaginosis Bacterial vaginosis (BV) is a vaginal infection where the normal balance of bacteria in the vagina is disrupted. The normal balance is then replaced by an overgrowth of certain bacteria. There are several different kinds of bacteria that can cause BV. BV is the most common vaginal infection in women of childbearing age. CAUSES   The cause of BV is not fully understood. BV develops when there is an increase or imbalance of harmful bacteria.  Some activities or behaviors can upset the normal balance of bacteria in the vagina and put women at increased risk including:  Having a new sex partner or multiple sex partners.  Douching.  Using an intrauterine device (IUD) for contraception.  It is not clear what role sexual activity plays in the development of BV. However, women that have never had sexual intercourse are rarely infected with BV. Women do not get BV from toilet seats, bedding, swimming pools or from touching objects around them.  SYMPTOMS   Grey vaginal discharge.  A fish-like odor with discharge, especially after sexual intercourse.  Itching or burning of the vagina and vulva.  Burning or pain with urination.  Some women have no signs or symptoms at all. DIAGNOSIS  Your caregiver must examine the vagina for signs of BV. Your caregiver will perform lab tests and look at the sample of vaginal fluid through a microscope. They will look for bacteria and abnormal cells (clue cells), a pH test higher than 4.5, and a positive amine test all associated with BV.  RISKS AND COMPLICATIONS   Pelvic inflammatory disease (PID).  Infections following gynecology surgery.  Developing HIV.  Developing herpes virus. TREATMENT  Sometimes BV will clear up without treatment. However, all women with symptoms of BV should be treated to avoid complications, especially if gynecology surgery is planned. Female partners generally do not need to be treated. However, BV may spread  between female sex partners so treatment is helpful in preventing a recurrence of BV.   BV may be treated with antibiotics. The antibiotics come in either pill or vaginal cream forms. Either can be used with nonpregnant or pregnant women, but the recommended dosages differ. These antibiotics are not harmful to the baby.  BV can recur after treatment. If this happens, a second round of antibiotics will often be prescribed.  Treatment is important for pregnant women. If not treated, BV can cause a premature delivery, especially for a pregnant woman who had a premature birth in the past. All pregnant women who have symptoms of BV should be checked and treated.  For chronic reoccurrence of BV, treatment with a type of prescribed gel vaginally twice a week is helpful. HOME CARE INSTRUCTIONS   Finish all medication as directed by your caregiver.  Do not have sex until treatment is completed.  Tell your sexual partner that you have a vaginal infection. They should see their caregiver and be treated if they have problems, such as a mild rash or itching.  Practice safe sex. Use condoms. Only have 1 sex partner. PREVENTION  Basic prevention steps can help reduce the risk of upsetting the natural balance of bacteria in the vagina and developing BV:  Do not have sexual intercourse (be abstinent).  Do not douche.  Use all of the medicine prescribed for treatment of BV, even if the signs and symptoms go away.  Tell your sex partner if you have BV. That way, they can be treated, if needed, to prevent reoccurrence. SEEK MEDICAL CARE IF:     Your symptoms are not improving after 3 days of treatment.  You have increased discharge, pain, or fever. MAKE SURE YOU:   Understand these instructions.  Will watch your condition.  Will get help right away if you are not doing well or get worse. FOR MORE INFORMATION  Division of STD Prevention (DSTDP), Centers for Disease Control and Prevention:  www.cdc.gov/std American Social Health Association (ASHA): www.ashastd.org  Document Released: 08/05/2005 Document Revised: 10/28/2011 Document Reviewed: 01/26/2009 ExitCare Patient Information 2014 [employer].  

## 2013-02-17 NOTE — Progress Notes (Signed)
   [address], [place] [address]   Phone [phone]  Subjective:    Patient ID: Ashley Kline, female    DOB: Nov 20, 1960, 52 y.o.   MRN: [id number]  HPI Pt presents to clinic with vaginal irritation and foul odor.  She is having no vaginal discharge or urinary symptoms.  She has not had a change in her sexual partner.  She has not change her bubble bath or soap but does use a lot of lotions.  She did change her tampons this past menses.     Review of Systems  Genitourinary: Positive for vaginal pain (irritation and odor). Negative for dysuria, urgency, frequency and vaginal discharge.       Objective:   Physical Exam  Vitals reviewed. Constitutional: She is oriented to person, place, and time. She appears well-developed and well-nourished.  Eyes: Conjunctivae are normal.  Pulmonary/Chest: Effort normal.  Abdominal: Soft.  Genitourinary: Pelvic exam was performed with patient supine. No labial fusion. There is no rash, tenderness, lesion or injury on the right labia. There is no rash, tenderness, lesion or injury on the left labia. Cervix exhibits no discharge and no friability. Vaginal discharge (thin white d/c) found.  Neurological: She is alert and oriented to person, place, and time.  Skin: Skin is warm and dry.  Psychiatric: She has a normal mood and affect. Her behavior is normal. Judgment and thought content normal.       Assessment & Plan:  Vaginal irritation - Plan: POCT urinalysis dipstick, POCT Wet Prep with KOH  BV (bacterial vaginosis) - Plan: metroNIDAZOLE (FLAGYL) 500 MG tablet  Pt to aware of changing tampons because that is what caused her problem in Dec.  She should monitor her use of scented feminine products because they can help her develop BV in the future.  Benny Lennert PA-C 02/17/2013 10:49 AM

## 2013-06-20 ENCOUNTER — Ambulatory Visit (INDEPENDENT_AMBULATORY_CARE_PROVIDER_SITE_OTHER): Payer: BC Managed Care – PPO | Admitting: Emergency Medicine

## 2013-06-20 VITALS — BP 110/74 | HR 76 | Temp 98.8°F | Resp 18 | Ht 65.0 in | Wt 173.0 lb

## 2013-06-20 DIAGNOSIS — L03012 Cellulitis of left finger: Secondary | ICD-10-CM

## 2013-06-20 DIAGNOSIS — IMO0002 Reserved for concepts with insufficient information to code with codable children: Secondary | ICD-10-CM

## 2013-06-20 MED ORDER — SULFAMETHOXAZOLE-TRIMETHOPRIM 800-160 MG PO TABS: 1.0000 | ORAL_TABLET | Freq: Two times a day (BID) | ORAL | Status: DC

## 2013-06-20 NOTE — Patient Instructions (Signed)
Paronychia Paronychia is an inflammatory reaction involving the folds of the skin surrounding the fingernail. This is commonly caused by an infection in the skin around a nail. The most common cause of paronychia is frequent wetting of the hands (as seen with bartenders, food servers, nurses or others who wet their hands). This makes the skin around the fingernail susceptible to infection by bacteria (germs) or fungus. Other predisposing factors are:  Aggressive manicuring.  Nail biting.  Thumb sucking. The most common cause is a staphylococcal (a type of germ) infection, or a fungal (Candida) infection. When caused by a germ, it usually comes on suddenly with redness, swelling, pus and is often painful. It may get under the nail and form an abscess (collection of pus), or form an abscess around the nail. If the nail itself is infected with a fungus, the treatment is usually prolonged and may require oral medicine for up to one year. Your caregiver will determine the length of time treatment is required. The paronychia caused by bacteria (germs) may largely be avoided by not pulling on hangnails or picking at cuticles. When the infection occurs at the tips of the finger it is called felon. When the cause of paronychia is from the herpes simplex virus (HSV) it is called herpetic whitlow. TREATMENT  When an abscess is present treatment is often incision and drainage. This means that the abscess must be cut open so the pus can get out. When this is done, the following home care instructions should be followed. HOME CARE INSTRUCTIONS   It is important to keep the affected fingers very dry. Rubber or plastic gloves over cotton gloves should be used whenever the hand must be placed in water.  Keep wound clean, dry and dressed as suggested by your caregiver between warm soaks or warm compresses.  Soak in warm water for fifteen to twenty minutes three to four times per day for bacterial infections. Fungal  infections are very difficult to treat, so often require treatment for long periods of time.  For bacterial (germ) infections take antibiotics (medicine which kill germs) as directed and finish the prescription, even if the problem appears to be solved before the medicine is gone.  Only take over-the-counter or prescription medicines for pain, discomfort, or fever as directed by your caregiver. SEEK IMMEDIATE MEDICAL CARE IF:  You have redness, swelling, or increasing pain in the wound.  You notice pus coming from the wound.  You have a fever.  You notice a bad smell coming from the wound or dressing. Document Released: 01/29/2001 Document Revised: 10/28/2011 Document Reviewed: 09/30/2008 ExitCare Patient Information 2014 [employer].  

## 2013-06-20 NOTE — Progress Notes (Signed)
[facility] [address] [place] [address] [phone]  Date:  06/20/2013   Name:  Ashley Kline   DOB:  Dec 19, 1960   MRN:  [id number]  PCP:  No PCP Per Patient    Chief Complaint: Foot Pain   History of Present Illness:  Ashley Kline is a 52 y.o. very pleasant female patient who presents with the following:  Had a pedicure 2 weeks ago and has swelling and tenderness base of left great toe nail.  No fever or chills.  No improvement with over the counter medications or other home remedies. Denies other complaint or health concern today.   There are no active problems to display for this patient.   Past Medical History  Diagnosis Date  . Renal disorder     History reviewed. No pertinent past surgical history.  History  Substance Use Topics  . Smoking status: Never Smoker   . Smokeless tobacco: Not on file  . Alcohol Use: No    Family History  Problem Relation Age of Onset  . Cancer Father     No Known Allergies  Medication list has been reviewed and updated.  No current outpatient prescriptions on file prior to visit.   No current facility-administered medications on file prior to visit.    Review of Systems:  As per HPI, otherwise negative.    Physical Examination: Filed Vitals:   06/20/13 1405  BP: 110/74  Pulse: 76  Temp: 98.8 F (37.1 C)  Resp: 18   Filed Vitals:   06/20/13 1405  Height: 5\' 5"  (1.651 m)  Weight: 173 lb (78.472 kg)   Body mass index is 28.79 kg/(m^2). Ideal Body Weight: Weight in (lb) to have BMI = 25: 149.9   GEN: WDWN, NAD, Non-toxic, Alert & Oriented x 3 HEENT: Atraumatic, Normocephalic.  Ears and Nose: No external deformity. EXTR: No clubbing/cyanosis/edema NEURO: Normal gait.  PSYCH: Normally interactive. Conversant. Not depressed or anxious appearing.  Calm demeanor.  LEFT foot:  Paronychia left great toenail  Assessment and Plan: Paronychia Septra   Signed,  Phillips Odor,  MD

## 2013-08-07 ENCOUNTER — Ambulatory Visit (INDEPENDENT_AMBULATORY_CARE_PROVIDER_SITE_OTHER): Payer: BC Managed Care – PPO | Admitting: Physician Assistant

## 2013-08-07 VITALS — BP 110/66 | HR 96 | Temp 100.3°F | Ht 66.0 in | Wt 177.0 lb

## 2013-08-07 DIAGNOSIS — R509 Fever, unspecified: Secondary | ICD-10-CM

## 2013-08-07 DIAGNOSIS — B9789 Other viral agents as the cause of diseases classified elsewhere: Secondary | ICD-10-CM

## 2013-08-07 DIAGNOSIS — B349 Viral infection, unspecified: Secondary | ICD-10-CM

## 2013-08-07 DIAGNOSIS — R05 Cough: Secondary | ICD-10-CM

## 2013-08-07 DIAGNOSIS — R059 Cough, unspecified: Secondary | ICD-10-CM

## 2013-08-07 LAB — POCT CBC
Granulocyte percent: 86.5 %G — AB (ref 37–80)
HCT, POC: 39.6 % (ref 37.7–47.9)
Hemoglobin: 12 g/dL — AB (ref 12.2–16.2)
Lymph, poc: 0.5 — AB (ref 0.6–3.4)
MCH, POC: 27.7 pg (ref 27–31.2)
MCHC: 30.3 g/dL — AB (ref 31.8–35.4)
MCV: 91.5 fL (ref 80–97)
MID (cbc): 0.5 (ref 0–0.9)
MPV: 8.3 fL (ref 0–99.8)
POC Granulocyte: 6.4 (ref 2–6.9)
POC LYMPH PERCENT: 7.1 %L — AB (ref 10–50)
POC MID %: 6.4 %M (ref 0–12)
Platelet Count, POC: 243 10*3/uL (ref 142–424)
RBC: 4.33 M/uL (ref 4.04–5.48)
RDW, POC: 12.3 %
WBC: 7.4 10*3/uL (ref 4.6–10.2)

## 2013-08-07 LAB — POCT INFLUENZA A/B
Influenza A, POC: NEGATIVE
Influenza B, POC: NEGATIVE

## 2013-08-07 MED ORDER — OSELTAMIVIR PHOSPHATE 75 MG PO CAPS: 75.0000 mg | ORAL_CAPSULE | Freq: Two times a day (BID) | ORAL | Status: DC

## 2013-08-07 MED ORDER — HYDROCOD POLST-CHLORPHEN POLST 10-8 MG/5ML PO LQCR: 5.0000 mL | Freq: Two times a day (BID) | ORAL | Status: DC | PRN

## 2013-08-07 NOTE — Progress Notes (Signed)
   Subjective:    Patient ID: Ashley Kline, female    DOB: [birth year], 52 y.o.   MRN: [id number]  HPI 52 year old female presents for evaluation of acute onset of nasal congestion, dry cough, fever, chills, body aches, and pain in her chest while coughing.  Symptoms worsened overnight and she decided to come in today for evaluation of possible influenza.  No known contacts but does work in [employer].  Did not get her flu shot this year.  Took Nyquil last night which did help her sleep.   Patient is otherwise doing well with no other concerns today.     Review of Systems  Constitutional: Positive for fever and chills.  HENT: Positive for congestion. Negative for ear pain and sore throat.   Respiratory: Positive for cough. Negative for shortness of breath and wheezing.   Neurological: Positive for headaches. Negative for dizziness.       Objective:   Physical Exam  Constitutional: She is oriented to person, place, and time. She appears well-developed and well-nourished.  HENT:  Head: Normocephalic and atraumatic.  Right Ear: Hearing, tympanic membrane, external ear and ear canal normal.  Left Ear: Hearing, tympanic membrane, external ear and ear canal normal.  Mouth/Throat: Uvula is midline, oropharynx is clear and moist and mucous membranes are normal.  Eyes: Conjunctivae are normal.  Neck: Normal range of motion.  Cardiovascular: Normal rate, regular rhythm and normal heart sounds.   Pulmonary/Chest: Effort normal and breath sounds normal.  Neurological: She is alert and oriented to person, place, and time.  Psychiatric: She has a normal mood and affect. Her behavior is normal. Judgment and thought content normal.     Results for orders placed in visit on 08/07/13  POCT INFLUENZA A/B      Result Value Range   Influenza A, POC Negative     Influenza B, POC Negative    POCT CBC      Result Value Range   WBC 7.4  4.6 - 10.2 K/uL   Lymph, poc 0.5 (*) 0.6 - 3.4   POC LYMPH PERCENT  7.1 (*) 10 - 50 %L   MID (cbc) 0.5  0 - 0.9   POC MID % 6.4  0 - 12 %M   POC Granulocyte 6.4  2 - 6.9   Granulocyte percent 86.5 (*) 37 - 80 %G   RBC 4.33  4.04 - 5.48 M/uL   Hemoglobin 12.0 (*) 12.2 - 16.2 g/dL   HCT, POC 52.8  41.3 - 47.9 %   MCV 91.5  80 - 97 fL   MCH, POC 27.7  27 - 31.2 pg   MCHC 30.3 (*) 31.8 - 35.4 g/dL   RDW, POC 24.4     Platelet Count, POC 243  142 - 424 K/uL   MPV 8.3  0 - 99.8 fL        Assessment & Plan:  Fever, unspecified - Plan: POCT Influenza A/B, POCT CBC  Viral illness - Plan: oseltamivir (TAMIFLU) 75 MG capsule  Cough - Plan: chlorpheniramine-HYDROcodone (TUSSIONEX PENNKINETIC ER) 10-8 MG/5ML LQCR  Despite negative flu test, will treat for suspected influenza.  Start Tamiflu 75 mg bid x 5 days Increase fluids and rest Continue ibuprofen or tylenol as needed for fever and chills Follow up if symptoms worsen or fail to improve.

## 2013-08-08 ENCOUNTER — Telehealth: Payer: Self-pay

## 2013-08-08 DIAGNOSIS — B349 Viral infection, unspecified: Secondary | ICD-10-CM

## 2013-08-08 NOTE — Telephone Encounter (Signed)
Patient called stating that [facility] did not receive Tamiflu. I informed her that the computer shows we sent it, but that I would have someone review her chart and send it again and to allow 24-48 hrs because we are busy and it is the weekend. I also offered the suggestion to call [facility] and have them call us directly. She was not happy with this response at all and became very rude and was yelling on the phone. She asked to speak to someone else. I informed her we just had an emergency and that nobody was available. After a few minutes, Rejeana Brock was available and spoke to her. Rejeana Brock informed her that we would try to get it done as soon as possible, but we did have an emergency and it may be a while before we can get it done and that generally it takes 24-48 hours for staff to review the call. Again, she was not happy with the response and was rude to [place] as well.  Can we please resend or call in tamiflu for patient to [facility] on [address]

## 2013-08-09 MED ORDER — OSELTAMIVIR PHOSPHATE 75 MG PO CAPS: 75.0000 mg | ORAL_CAPSULE | Freq: Two times a day (BID) | ORAL | Status: DC

## 2013-08-09 NOTE — Telephone Encounter (Signed)
Resent called patient  

## 2013-08-09 NOTE — Telephone Encounter (Signed)
Voicemail not set up.

## 2013-08-25 ENCOUNTER — Ambulatory Visit (INDEPENDENT_AMBULATORY_CARE_PROVIDER_SITE_OTHER): Payer: BC Managed Care – PPO | Admitting: Family Medicine

## 2013-08-25 VITALS — BP 110/60 | HR 68 | Temp 97.7°F | Resp 18 | Ht 65.5 in | Wt 176.0 lb

## 2013-08-25 DIAGNOSIS — J45909 Unspecified asthma, uncomplicated: Secondary | ICD-10-CM

## 2013-08-25 DIAGNOSIS — J683 Other acute and subacute respiratory conditions due to chemicals, gases, fumes and vapors: Secondary | ICD-10-CM

## 2013-08-25 MED ORDER — METHYLPREDNISOLONE 4 MG PO TABS: 8.0000 mg | ORAL_TABLET | Freq: Every day | ORAL | Status: DC

## 2013-08-25 NOTE — Progress Notes (Signed)
Subjective:  This chart was scribed for Elvina Sidle, MD by Carl Best, Medical Scribe. This patient was seen in Room 11 and the patient's care was started at 7:58 PM.   Patient ID: Ashley Kline, female    DOB: 02-25-1961, 53 y.o.   MRN: [id number]  HPI HPI Comments: Ashley Kline is a 53 y.o. female who [facility] complaining of constant chest congestion, SOB, fatigue and dry cough that started three days ago.  The patient states that she experienced flu-like symptoms the week before Christmas and came to [facility] for treatment.  She states that her flu test was negative and she was prescribed Tamiflu and Tussionex for her symptoms.  She states that she felt as though her strength was returning and her symptoms alleviating the week after Christmas but states that her symptoms have slowly returned.  She states that last night she experienced some SOB which was alleviated after she propped pillows behind her.  She denies headache, chills, and fever as associated symptoms.  The patient states that she works at an [employer].  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    Past Medical History  Diagnosis Date   Renal disorder    History reviewed. No pertinent past surgical history. Family History  Problem Relation Age of Onset   Cancer Father    History   Social History   Marital Status: Single    Spouse Name: N/A    Number of Children: N/A   Years of Education: N/A   Occupational History   Not on file.   Social History Main Topics   Smoking status: Never Smoker    Smokeless tobacco: Not on file   Alcohol Use: No   Drug Use: No   Sexual Activity: Not on file   Other Topics Concern   Not on file   Social History Narrative   No narrative on file   No Known Allergies  Review of Systems  Constitutional: Positive for fatigue. Negative for fever and diaphoresis.  HENT: Positive for congestion.   Respiratory: Positive  for cough and shortness of breath.   Neurological: Positive for headaches.     Objective:  Physical Exam  Nursing note and vitals reviewed. Constitutional: She is oriented to person, place, and time. She appears well-developed and well-nourished. No distress.  HENT:  Head: Normocephalic and atraumatic.  Eyes: Conjunctivae and EOM are normal. Pupils are equal, round, and reactive to light.  Neck: Normal range of motion and phonation normal.  Cardiovascular: Normal rate, regular rhythm, normal heart sounds and intact distal pulses.   No murmur heard. Pulmonary/Chest: Effort normal. She exhibits no tenderness.  Musculoskeletal: Normal range of motion.  Neurological: She is alert and oriented to person, place, and time. No cranial nerve deficit. She exhibits normal muscle tone. Coordination normal.  Skin: Skin is warm and dry.  Psychiatric: She has a normal mood and affect. Her behavior is normal. Judgment and thought content normal.  end expiratory wheezes   BP 110/60   Pulse 68   Temp(Src) 97.7 F (36.5 C) (Oral)   Resp 18   Ht 5' 5.5" (1.664 m)   Wt 176 lb (79.833 kg)   BMI 28.83 kg/m2   SpO2 100%   LMP 06/15/2013 Assessment & Plan:   Meds ordered this encounter  Medications   methylPREDNISolone (MEDROL) 4 MG tablet    Sig: Take 2 tablets (8 mg total) by mouth daily.  Dispense:  6 tablet    Refill:  2    1. Reactive airways dysfunction syndrome     I personally performed the services described in this documentation, which was scribed in my presence. The recorded information has been reviewed and is accurate.  Kenyon AnaKurt Lauenstein,MD

## 2013-08-25 NOTE — Patient Instructions (Signed)
Asthma, Acute Bronchospasm °Acute bronchospasm caused by asthma is also referred to as an asthma attack. Bronchospasm means your air passages become narrowed. The narrowing is caused by inflammation and tightening of the muscles in the air tubes (bronchi) in your lungs. This can make it hard to breath or cause you to wheeze and cough. °CAUSES °Possible triggers are: °· Animal dander from the skin, hair, or feathers of animals. °· Dust mites contained in house dust. °· Cockroaches. °· Pollen from trees or grass. °· Mold. °· Cigarette or tobacco smoke. °· Air pollutants such as dust, household cleaners, hair sprays, aerosol sprays, paint fumes, strong chemicals, or strong odors. °· Cold air or weather changes. Cold air may trigger inflammation. Winds increase molds and pollens in the air. °· Strong emotions such as crying or laughing hard. °· Stress. °· Certain medicines such as aspirin or beta-blockers. °· Sulfites in foods and drinks, such as dried fruits and wine. °· Infections or inflammatory conditions, such as a flu, cold, or inflammation of the nasal membranes (rhinitis). °· Gastroesophageal reflux disease (GERD). GERD is a condition where stomach acid backs up into your throat (esophagus). °· Exercise or strenuous activity. °SIGNS AND SYMPTOMS  °· Wheezing. °· Excessive coughing, particularly at night. °· Chest tightness. °· Shortness of breath. °DIAGNOSIS  °Your health care provider will ask you about your medical history and perform a physical exam. A chest X-ray or blood testing may be performed to look for other causes of your symptoms or other conditions that may have triggered your asthma attack.  °TREATMENT  °Treatment is aimed at reducing inflammation and opening up the airways in your lungs.  Most asthma attacks are treated with inhaled medicines. These include quick relief or rescue medicines (such as bronchodilators) and controller medicines (such as inhaled corticosteroids). These medicines are  sometimes given through an inhaler or a nebulizer. Systemic steroid medicine taken by mouth or given through an IV tube also can be used to reduce the inflammation when an attack is moderate or severe. Antibiotic medicines are only used if a bacterial infection is present.  °HOME CARE INSTRUCTIONS  °· Rest. °· Drink plenty of liquids. This helps the mucus to remain thin and be easily coughed up. Only use caffeine in moderation and do not use alcohol until you have recovered from your illness. °· Do not smoke. Avoid being exposed to secondhand smoke. °· You play a critical role in keeping yourself in good health. Avoid exposure to things that cause you to wheeze or to have breathing problems. °· Keep your medicines up to date and available. Carefully follow your health care provider's treatment plan. °· Take your medicine exactly as prescribed. °· When pollen or pollution is bad, keep windows closed and use an air conditioner or go to places with air conditioning. °· Asthma requires careful medical care. See your health care provider for a follow-up as advised. If you are more than [redacted] weeks pregnant and you were prescribed any new medicines, let your obstetrician know about the visit and how you are doing. Follow-up with your health care provider as directed. °· After you have recovered from your asthma attack, make an appointment with your outpatient doctor to talk about ways to reduce the likelihood of future attacks. If you do not have a doctor who manages your asthma, make an appointment with a primary care doctor to discuss your asthma. °SEEK IMMEDIATE MEDICAL CARE IF:  °· You are getting worse. °· You have trouble breathing. If severe, call   your local emergency services (911 in the [place] °· You develop chest pain or discomfort. °· You are vomiting. °· You are not able to keep fluids down. °· You are coughing up yellow, green, brown, or bloody sputum. °· You have a fever and your symptoms suddenly get  worse. °· You have trouble swallowing. °MAKE SURE YOU:  °· Understand these instructions. °· Will watch your condition. °· Will get help right away if you are not doing well or get worse. °Document Released: 11/20/2006 Document Revised: 04/07/2013 Document Reviewed: 02/10/2013 °ExitCare® Patient Information ©2014 [employer]. ° °

## 2013-10-06 ENCOUNTER — Ambulatory Visit: Payer: BC Managed Care – PPO

## 2013-10-06 ENCOUNTER — Ambulatory Visit (INDEPENDENT_AMBULATORY_CARE_PROVIDER_SITE_OTHER): Payer: BC Managed Care – PPO | Admitting: Internal Medicine

## 2013-10-06 VITALS — BP 104/68 | HR 87 | Temp 97.5°F | Resp 18 | Ht 64.5 in | Wt 180.4 lb

## 2013-10-06 DIAGNOSIS — R079 Chest pain, unspecified: Secondary | ICD-10-CM

## 2013-10-06 DIAGNOSIS — N390 Urinary tract infection, site not specified: Secondary | ICD-10-CM

## 2013-10-06 DIAGNOSIS — R109 Unspecified abdominal pain: Secondary | ICD-10-CM

## 2013-10-06 DIAGNOSIS — R300 Dysuria: Secondary | ICD-10-CM

## 2013-10-06 LAB — POCT CBC
GRANULOCYTE PERCENT: 57.9 % (ref 37–80)
HEMATOCRIT: 44.2 % (ref 37.7–47.9)
Hemoglobin: 13.5 g/dL (ref 12.2–16.2)
Lymph, poc: 2.4 (ref 0.6–3.4)
MCH, POC: 28 pg (ref 27–31.2)
MCHC: 30.5 g/dL — AB (ref 31.8–35.4)
MCV: 91.5 fL (ref 80–97)
MID (cbc): 0.5 (ref 0–0.9)
MPV: 9 fL (ref 0–99.8)
PLATELET COUNT, POC: 272 10*3/uL (ref 142–424)
POC Granulocyte: 3.9 (ref 2–6.9)
POC LYMPH PERCENT: 34.8 %L (ref 10–50)
POC MID %: 7.3 %M (ref 0–12)
RBC: 4.83 M/uL (ref 4.04–5.48)
RDW, POC: 13.6 %
WBC: 6.8 10*3/uL (ref 4.6–10.2)

## 2013-10-06 LAB — POCT URINALYSIS DIPSTICK
Bilirubin, UA: NEGATIVE
Blood, UA: NEGATIVE
Glucose, UA: NEGATIVE
Ketones, UA: NEGATIVE
Leukocytes, UA: NEGATIVE
Nitrite, UA: NEGATIVE
Protein, UA: NEGATIVE
Spec Grav, UA: 1.02
Urobilinogen, UA: 0.2
pH, UA: 6

## 2013-10-06 LAB — POCT UA - MICROSCOPIC ONLY
Casts, Ur, LPF, POC: NEGATIVE
Crystals, Ur, HPF, POC: NEGATIVE
Yeast, UA: NEGATIVE

## 2013-10-06 MED ORDER — KETOROLAC TROMETHAMINE 10 MG PO TABS: 10.0000 mg | ORAL_TABLET | Freq: Four times a day (QID) | ORAL | Status: DC | PRN

## 2013-10-06 MED ORDER — SULFAMETHOXAZOLE-TMP DS 800-160 MG PO TABS: 1.0000 | ORAL_TABLET | Freq: Two times a day (BID) | ORAL | Status: AC

## 2013-10-06 NOTE — Progress Notes (Signed)
Subjective:    Patient ID: Ashley Kline, female    DOB: 1960/09/19, 53 y.o.   MRN: [id number]  HPI CC: sharp pain left upper abdomen/ left lower ribs area . 5/10 increases with motion, non radiating, feels congested mild cough no sputum, no fever, onset one day ago. Also is concerned about mild redness to inner lower lid of the right eye for one day.no visual disturbance ,no injury to the eye.Nonsmoker no bcp or hormones. Is concerned because she has had sevseral illnesses in the past 3 mmonths and is concerned this may be realted. She was treated for a toe infection in Nov. A flu like illness in dec and bronchitis in January.    Review of Systems  Respiratory:       Slight cough l rib chest upper abd pain  All other systems reviewed and are negative.       Objective:   Physical Exam  Nursing note and vitals reviewed. Constitutional: She is oriented to person, place, and time. She appears well-developed and well-nourished.  HENT:  Head: Normocephalic and atraumatic.  Eyes: Conjunctivae and EOM are normal. Pupils are equal, round, and reactive to light.  Neck: Normal range of motion. Neck supple.  Cardiovascular: Normal rate and normal heart sounds.   Pulmonary/Chest: Effort normal and breath sounds normal.  Slightly tender left rib area  Abdominal: Soft. Bowel sounds are normal.  Musculoskeletal: Normal range of motion.  Neurological: She is alert and oriented to person, place, and time.  Skin: Skin is warm and dry.  Psychiatric: She has a normal mood and affect. Her behavior is normal. Judgment and thought content normal.    ekg nsr hr 67 normal pr oqrs st t waves. No evidence of mi.  [facility] reading (PRIMARY) by  Dr. Mindi JunkerGottlieb no infiltrate normal heart size.Marland Kitchen.      Results for orders placed in visit on 10/06/13  POCT UA - MICROSCOPIC ONLY      Result Value Ref Range   WBC, Ur, HPF, POC 1-3     RBC, urine, microscopic 1-2     Bacteria, U Microscopic trace     Mucus, UA  trace     Epithelial cells, urine per micros 1-3     Crystals, Ur, HPF, POC neg     Casts, Ur, LPF, POC neg     Yeast, UA neg    POCT URINALYSIS DIPSTICK      Result Value Ref Range   Color, UA yellow     Clarity, UA clear     Glucose, UA neg     Bilirubin, UA neg     Ketones, UA neg     Spec Grav, UA 1.020     Blood, UA neg     pH, UA 6.0     Protein, UA neg     Urobilinogen, UA 0.2     Nitrite, UA neg     Leukocytes, UA Negative    POCT CBC      Result Value Ref Range   WBC 6.8  4.6 - 10.2 K/uL   Lymph, poc 2.4  0.6 - 3.4   POC LYMPH PERCENT 34.8  10 - 50 %L   MID (cbc) 0.5  0 - 0.9   POC MID % 7.3  0 - 12 %M   POC Granulocyte 3.9  2 - 6.9   Granulocyte percent 57.9  37 - 80 %G   RBC 4.83  4.04 - 5.48 M/uL   Hemoglobin 13.5  12.2 - 16.2 g/dL   HCT, POC 16.1  09.6 - 47.9 %   MCV 91.5  80 - 97 fL   MCH, POC 28.0  27 - 31.2 pg   MCHC 30.5 (*) 31.8 - 35.4 g/dL   RDW, POC 04.5     Platelet Count, POC 272  142 - 424 K/uL   MPV 9.0  0 - 99.8 fL   Assessment & Plan:  Left flank pain with trace rbc wbc bacti in urine. Wbc is normal.Will rx with nsai and antibiotic increase fluids.

## 2013-10-06 NOTE — Patient Instructions (Signed)
Bactrim ds 1 tab ever 12 hours for 7 days. Increase fluids. Toradol 1 tab every 6 hours if needed for pain. If the symptoms continue or do not improve return for further evaluation.

## 2013-10-07 ENCOUNTER — Emergency Department (HOSPITAL_COMMUNITY): Payer: BC Managed Care – PPO

## 2013-10-07 ENCOUNTER — Encounter (HOSPITAL_COMMUNITY): Payer: Self-pay | Admitting: Emergency Medicine

## 2013-10-07 ENCOUNTER — Emergency Department (HOSPITAL_COMMUNITY)
Admission: EM | Admit: 2013-10-07 | Discharge: 2013-10-07 | Disposition: A | Discharge status: Home / Self Care | Payer: BC Managed Care – PPO | Attending: Emergency Medicine | Admitting: Emergency Medicine

## 2013-10-07 DIAGNOSIS — Z792 Long term (current) use of antibiotics: Secondary | ICD-10-CM | POA: Insufficient documentation

## 2013-10-07 DIAGNOSIS — J40 Bronchitis, not specified as acute or chronic: Secondary | ICD-10-CM | POA: Insufficient documentation

## 2013-10-07 DIAGNOSIS — R109 Unspecified abdominal pain: Secondary | ICD-10-CM | POA: Insufficient documentation

## 2013-10-07 DIAGNOSIS — Z87448 Personal history of other diseases of urinary system: Secondary | ICD-10-CM | POA: Insufficient documentation

## 2013-10-07 DIAGNOSIS — Z79899 Other long term (current) drug therapy: Secondary | ICD-10-CM | POA: Insufficient documentation

## 2013-10-07 LAB — COMPREHENSIVE METABOLIC PANEL
ALBUMIN: 4.1 g/dL (ref 3.5–5.2)
ALK PHOS: 111 U/L (ref 39–117)
ALT: 31 U/L (ref 0–35)
AST: 30 U/L (ref 0–37)
BILIRUBIN TOTAL: 0.3 mg/dL (ref 0.3–1.2)
BUN: 9 mg/dL (ref 6–23)
CHLORIDE: 100 meq/L (ref 96–112)
CO2: 25 mEq/L (ref 19–32)
Calcium: 9.5 mg/dL (ref 8.4–10.5)
Creatinine, Ser: 0.62 mg/dL (ref 0.50–1.10)
GFR calc Af Amer: >90 mL/min (ref >90)
GFR calc non Af Amer: >90 mL/min (ref >90)
Glucose, Bld: 95 mg/dL (ref 70–99)
Potassium: 3.5 mEq/L — ABNORMAL LOW (ref 3.7–5.3)
SODIUM: 138 meq/L (ref 137–147)
TOTAL PROTEIN: 8.4 g/dL — AB (ref 6.0–8.3)

## 2013-10-07 LAB — CBC WITH DIFFERENTIAL/PLATELET
BASOS ABS: 0 10*3/uL (ref 0.0–0.1)
BASOS PCT: 0 % (ref 0–1)
EOS ABS: 0.1 10*3/uL (ref 0.0–0.7)
Eosinophils Relative: 1 % (ref 0–5)
HCT: 40.8 % (ref 36.0–46.0)
HEMOGLOBIN: 13.5 g/dL (ref 12.0–15.0)
Lymphocytes Relative: 37 % (ref 12–46)
Lymphs Abs: 2.3 10*3/uL (ref 0.7–4.0)
MCH: 28.4 pg (ref 26.0–34.0)
MCHC: 33.1 g/dL (ref 30.0–36.0)
MCV: 85.9 fL (ref 78.0–100.0)
Monocytes Absolute: 0.4 10*3/uL (ref 0.1–1.0)
Monocytes Relative: 7 % (ref 3–12)
Neutro Abs: 3.4 10*3/uL (ref 1.7–7.7)
Neutrophils Relative %: 55 % (ref 43–77)
PLATELETS: 276 10*3/uL (ref 150–400)
RBC: 4.75 MIL/uL (ref 3.87–5.11)
RDW: 12.4 % (ref 11.5–15.5)
WBC: 6.2 10*3/uL (ref 4.0–10.5)

## 2013-10-07 LAB — URINALYSIS, ROUTINE W REFLEX MICROSCOPIC
BILIRUBIN URINE: NEGATIVE
GLUCOSE, UA: NEGATIVE mg/dL
Hgb urine dipstick: NEGATIVE
Ketones, ur: NEGATIVE mg/dL
Leukocytes, UA: NEGATIVE
Nitrite: NEGATIVE
PH: 7 (ref 5.0–8.0)
Protein, ur: NEGATIVE mg/dL
SPECIFIC GRAVITY, URINE: 1.002 — AB (ref 1.005–1.030)
Urobilinogen, UA: 0.2 mg/dL (ref 0.0–1.0)

## 2013-10-07 LAB — PRO B NATRIURETIC PEPTIDE: Pro B Natriuretic peptide (BNP): 9.3 pg/mL (ref 0–125)

## 2013-10-07 LAB — TROPONIN I

## 2013-10-07 MED ORDER — ALBUTEROL SULFATE HFA 108 (90 BASE) MCG/ACT IN AERS: 2.0000 | INHALATION_SPRAY | RESPIRATORY_TRACT | Status: DC | PRN

## 2013-10-07 MED ORDER — PREDNISONE 20 MG PO TABS: 60.0000 mg | ORAL_TABLET | Freq: Every day | ORAL | Status: DC

## 2013-10-07 NOTE — ED Provider Notes (Signed)
CSN: [id number]     Arrival date & time 10/07/13  1343 History   First MD Initiated Contact with Patient 10/07/13 1420     Chief Complaint  Patient presents with  . Shortness of Breath  . Abdominal Pain  . Conjunctivitis    right     (Consider location/radiation/quality/duration/timing/severity/associated sxs/prior Treatment) HPI Comments: Patient presents to the ER for evaluation of nasal congestion, cough, shortness of breath has been ongoing for approximately 2 weeks. Patient reports that she has had 2 separate episodes over the last several months that were similar. She was told it was bronchitis urgent care. The patient has become concerned that this is happening again. She has been doing research on the Internet and is concerned she might have endocarditis.  Patient also complaining of left lateral abdominal discomfort. This has been ongoing for approximately a month. She says that her stomach is more distended than usual, but has not any nausea, vomiting, diarrhea or constipation. Patient reports that the pain is on the inside. She feels it when she bends and twists.  Patient is a 53 y.o. female presenting with shortness of breath, abdominal pain, and conjunctivitis.  Shortness of Breath Associated symptoms: abdominal pain   Abdominal Pain Associated symptoms: shortness of breath   Conjunctivitis Associated symptoms include abdominal pain and shortness of breath.    Past Medical History  Diagnosis Date  . Renal disorder    History reviewed. No pertinent past surgical history. Family History  Problem Relation Age of Onset  . Cancer Father    History  Substance Use Topics  . Smoking status: Never Smoker   . Smokeless tobacco: Not on file  . Alcohol Use: No   OB History   Grav Para Term Preterm Abortions TAB SAB Ect Mult Living                 Review of Systems  HENT: Positive for congestion.   Respiratory: Positive for shortness of breath.   Gastrointestinal:  Positive for abdominal pain.  All other systems reviewed and are negative.      Allergies  Review of patient's allergies indicates no known allergies.  Home Medications   Current Outpatient Rx  Name  Route  Sig  Dispense  Refill  . chlorpheniramine-HYDROcodone (TUSSIONEX PENNKINETIC ER) 10-8 MG/5ML LQCR   Oral   Take 5 mLs by mouth every 12 (twelve) hours as needed for cough (cough).   60 mL   0   . ketorolac (TORADOL) 10 MG tablet   Oral   Take 1 tablet (10 mg total) by mouth every 6 (six) hours as needed.   20 tablet   0   . methylPREDNISolone (MEDROL) 4 MG tablet   Oral   Take 2 tablets (8 mg total) by mouth daily.   6 tablet   2   . oseltamivir (TAMIFLU) 75 MG capsule   Oral   Take 1 capsule (75 mg total) by mouth 2 (two) times daily.   10 capsule   0   . sulfamethoxazole-trimethoprim (BACTRIM DS) 800-160 MG per tablet   Oral   Take 1 tablet by mouth 2 (two) times daily.   14 tablet   0    BP 134/84  SpO2 97%  LMP 08/25/2013 Physical Exam  Constitutional: She is oriented to person, place, and time. She appears well-developed and well-nourished. No distress.  HENT:  Head: Normocephalic and atraumatic.  Right Ear: Hearing normal.  Left Ear: Hearing normal.  Nose: Nose normal.  Mouth/Throat: Oropharynx is clear and moist and mucous membranes are normal.  Eyes: Conjunctivae and EOM are normal. Pupils are equal, round, and reactive to light.  Neck: Normal range of motion. Neck supple.  Cardiovascular: Regular rhythm, S1 normal and S2 normal.  Exam reveals no gallop and no friction rub.   No murmur heard. Pulmonary/Chest: Effort normal and breath sounds normal. No respiratory distress. She exhibits no tenderness.  Abdominal: Soft. Normal appearance and bowel sounds are normal. There is no hepatosplenomegaly. There is no tenderness. There is no rebound, no guarding, no tenderness at McBurney's point and negative Murphy's sign. No hernia.  Musculoskeletal:  Normal range of motion.  Neurological: She is alert and oriented to person, place, and time. She has normal strength. No cranial nerve deficit or sensory deficit. Coordination normal. GCS eye subscore is 4. GCS verbal subscore is 5. GCS motor subscore is 6.  Skin: Skin is warm, dry and intact. No rash noted. No cyanosis.  Psychiatric: She has a normal mood and affect. Her speech is normal and behavior is normal. Thought content normal.    ED Course  Procedures (including critical care time) Labs Review Labs Reviewed  CBC WITH DIFFERENTIAL  COMPREHENSIVE METABOLIC PANEL  TROPONIN I  PRO B NATRIURETIC PEPTIDE  URINALYSIS, ROUTINE W REFLEX MICROSCOPIC   Imaging Review Dg Chest 2 View  10/07/2013   CLINICAL DATA:  Bronchitis  EXAM: CHEST  2 VIEW  COMPARISON:  DG CHEST 2 VIEW dated 11/20/2012  FINDINGS: Normal mediastinum and cardiac silhouette. Normal pulmonary vasculature. No evidence of effusion, infiltrate, or pneumothorax. No acute bony abnormality.  IMPRESSION: No acute cardiopulmonary process.   Electronically Signed   By: Genevive BiStewart  Edmunds M.D.   On: 10/07/2013 00:04   Dg Abd Acute W/chest  10/07/2013   CLINICAL DATA:  Cough, shortness of breath, abdominal pain and distension  EXAM: ACUTE ABDOMEN SERIES (ABDOMEN 2 VIEW & CHEST 1 VIEW)  COMPARISON:  Chest radiographs 10/06/2013  FINDINGS: Normal heart size, mediastinal contours, and pulmonary vascularity.  Minimal scarring left base.  Lungs otherwise clear.  No pleural effusion or pneumothorax.  Mild chronic peribronchial thickening.  Normal bowel gas pattern with scattered stool throughout colon.  No bowel dilatation or bowel wall thickening or free intraperitoneal air.  Right pelvic phlebolith.  No urinary tract calcification or acute osseous findings.  IMPRESSION: No acute abnormalities.   Electronically Signed   By: Ulyses SouthwardMark  Boles M.D.   On: 10/07/2013 14:49    EKG Interpretation    Date/Time:  Thursday October 07 2013 13:52:02  EST Ventricular Rate:  93 PR Interval:  211 QRS Duration: 69 QT Interval:  295 QTC Calculation: 367 R Axis:   -16 Text Interpretation:  Sinus or ectopic atrial rhythm Inferior infarct, age indeterminate Nonspecific ST and T wave abnormality No significant change since November 20 2012 Confirmed by WARD  DO, KRISTEN ([id number]) on 10/07/2013 1:59:16 PM            MDM   Final diagnoses:  None    Patient presents to the ER for evaluation of cough and congestion. Symptoms have been present for approximately 2 weeks. She has had several bouts of this last few months. Patient is become very concerned about the possibility of severe illness. She is very anxious. She is concerned about endocarditis, there are no sequela were parts of the history that he be concerned about this. There is no murmur. She is afebrile. Blood work was entirely normal. I did reassure the patient.  The last time she had similar symptoms she improved with the Medrol Dosepak. We'll place her back on prednisone and albuterol, followup as needed.    Gilda Crease, MD 10/07/13 [phone]

## 2013-10-07 NOTE — Discharge Instructions (Signed)

## 2013-10-07 NOTE — ED Notes (Signed)
Pt states that shob started about 2 weeks ago that are similar symptoms to when she had bronchitis back in Jan. Pt also c/o nasal congestion and headache, left side dull abd pain that comes and goes for month. Pt also states that she feels her abd is distended "bc i normally have a flat stomach" pt denies issues with n/v/d or constipation. Also red right eye that has come on two days ago without pain or irritation or visual disturbances.

## 2013-12-29 ENCOUNTER — Ambulatory Visit (INDEPENDENT_AMBULATORY_CARE_PROVIDER_SITE_OTHER): Payer: BC Managed Care – PPO | Admitting: Physician Assistant

## 2013-12-29 VITALS — BP 130/80 | HR 89 | Temp 98.0°F | Resp 17 | Ht 64.0 in | Wt 178.0 lb

## 2013-12-29 DIAGNOSIS — R109 Unspecified abdominal pain: Secondary | ICD-10-CM

## 2013-12-29 DIAGNOSIS — M549 Dorsalgia, unspecified: Secondary | ICD-10-CM

## 2013-12-29 DIAGNOSIS — B9689 Other specified bacterial agents as the cause of diseases classified elsewhere: Secondary | ICD-10-CM

## 2013-12-29 DIAGNOSIS — N760 Acute vaginitis: Secondary | ICD-10-CM

## 2013-12-29 LAB — POCT CBC
Granulocyte percent: 68 %G (ref 37–80)
HCT, POC: 40.4 % (ref 37.7–47.9)
Hemoglobin: 12.6 g/dL (ref 12.2–16.2)
Lymph, poc: 1.8 (ref 0.6–3.4)
MCH, POC: 27.7 pg (ref 27–31.2)
MCHC: 31.2 g/dL — AB (ref 31.8–35.4)
MCV: 88.9 fL (ref 80–97)
MID (CBC): 0.3 (ref 0–0.9)
MPV: 8.8 fL (ref 0–99.8)
POC Granulocyte: 4.6 (ref 2–6.9)
POC LYMPH %: 27.1 % (ref 10–50)
POC MID %: 4.9 %M (ref 0–12)
Platelet Count, POC: 367 10*3/uL (ref 142–424)
RBC: 4.55 M/uL (ref 4.04–5.48)
RDW, POC: 13.1 %
WBC: 6.7 10*3/uL (ref 4.6–10.2)

## 2013-12-29 LAB — POCT URINALYSIS DIPSTICK
BILIRUBIN UA: NEGATIVE
Glucose, UA: NEGATIVE
Ketones, UA: NEGATIVE
LEUKOCYTES UA: NEGATIVE
Nitrite, UA: NEGATIVE
PH UA: 5.5
Protein, UA: NEGATIVE
Spec Grav, UA: <=1.005
Urobilinogen, UA: 0.2

## 2013-12-29 LAB — POCT WET PREP WITH KOH
KOH PREP POC: NEGATIVE
RBC WET PREP PER HPF POC: NEGATIVE
Trichomonas, UA: NEGATIVE
Yeast Wet Prep HPF POC: NEGATIVE

## 2013-12-29 LAB — POCT URINE PREGNANCY: Preg Test, Ur: NEGATIVE

## 2013-12-29 LAB — POCT UA - MICROSCOPIC ONLY
Casts, Ur, LPF, POC: NEGATIVE
Crystals, Ur, HPF, POC: NEGATIVE
Mucus, UA: NEGATIVE
WBC, UR, HPF, POC: NEGATIVE
Yeast, UA: NEGATIVE

## 2013-12-29 MED ORDER — MELOXICAM 15 MG PO TABS: 15.0000 mg | ORAL_TABLET | Freq: Every day | ORAL | Status: AC

## 2013-12-29 MED ORDER — METRONIDAZOLE 500 MG PO TABS: 500.0000 mg | ORAL_TABLET | Freq: Two times a day (BID) | ORAL | Status: AC

## 2013-12-29 NOTE — Progress Notes (Signed)
Subjective:    Patient ID: Ashley Kline, female    DOB: Jan 19, 1961, 53 y.o.   MRN: [id number]  HPI   Ashley Kline is a very pleasant 53 yr old female here with concern for illness.  Reports she is having lower abdominal pain for a couple days.  She denies NVD.  No fever.  Denies vaginal symptoms.  No abnormal bleeding.  No urinary symptoms.    She is also having low back pain for the last few days as well.  No known injury.  Pain seems to have increased today.  It is bilateral.  No radiation.  No numbness, weakness.  Describes the pain as constant, dull, achy.  Lying flat on her back increases pain.  Bending forward also increases pain.  Standing seems to make it feel better.  She had to pause for a moment when getting out of bed today due to back pain, which she has never had to do before.  She has not taken anything for symptoms  She also notes some fatigue.  She is very concerned about her current symptoms.  She is concerned that she may have an ectopic pregnancy.  LMP about 2 wks ago - notes that this was "bright red" which is different for her.  Also thinks she may be nearing "the change of life."    Review of Systems  Constitutional: Positive for fatigue. Negative for fever, chills and appetite change.  HENT: Negative.   Respiratory: Negative.   Cardiovascular: Negative.   Gastrointestinal: Positive for abdominal pain (low). Negative for nausea, vomiting and diarrhea.  Genitourinary: Negative for dysuria, frequency, hematuria, vaginal bleeding, vaginal discharge and pelvic pain.  Neurological: Negative for weakness and numbness.       Objective:   Physical Exam  Vitals reviewed. Constitutional: She is oriented to person, place, and time. She appears well-developed and well-nourished. No distress.  HENT:  Head: Normocephalic and atraumatic.  Eyes: Conjunctivae are normal. No scleral icterus.  Cardiovascular: Normal rate, regular rhythm and normal heart sounds.   Pulmonary/Chest:  Effort normal and breath sounds normal. She has no wheezes. She has no rales.  Abdominal: Soft. Bowel sounds are normal. There is generalized tenderness. There is no rigidity, no rebound, no guarding, no CVA tenderness, no tenderness at McBurney's point and negative Murphy's sign.  Genitourinary: There is no rash, tenderness or lesion on the right labia. There is no rash, tenderness or lesion on the left labia. Cervix exhibits no motion tenderness. Right adnexum displays no mass, no tenderness and no fullness. Left adnexum displays no mass, no tenderness and no fullness. Vaginal discharge (thick, white) found.  Musculoskeletal:       Cervical back: Normal.       Thoracic back: Normal.       Lumbar back: She exhibits decreased range of motion, tenderness (mild) and pain. She exhibits no bony tenderness and no spasm.       Back:  Neurological: She is alert and oriented to person, place, and time.  Skin: Skin is warm and dry.  Psychiatric: She has a normal mood and affect. Her behavior is normal.    Results for orders placed in visit on 12/29/13  POCT UA - MICROSCOPIC ONLY      Result Value Ref Range   WBC, Ur, HPF, POC neg     RBC, urine, microscopic 1-3     Bacteria, U Microscopic trace     Mucus, UA neg     Epithelial cells, urine per  micros 1-3     Crystals, Ur, HPF, POC neg     Casts, Ur, LPF, POC neg     Yeast, UA neg    POCT URINALYSIS DIPSTICK      Result Value Ref Range   Color, UA yellow     Clarity, UA clear     Glucose, UA neg     Bilirubin, UA neg     Ketones, UA neg     Spec Grav, UA <=1.005     Blood, UA small     pH, UA 5.5     Protein, UA neg     Urobilinogen, UA 0.2     Nitrite, UA neg     Leukocytes, UA Negative    POCT CBC      Result Value Ref Range   WBC 6.7  4.6 - 10.2 K/uL   Lymph, poc 1.8  0.6 - 3.4   POC LYMPH PERCENT 27.1  10 - 50 %L   MID (cbc) 0.3  0 - 0.9   POC MID % 4.9  0 - 12 %M   POC Granulocyte 4.6  2 - 6.9   Granulocyte percent 68.0  37  - 80 %G   RBC 4.55  4.04 - 5.48 M/uL   Hemoglobin 12.6  12.2 - 16.2 g/dL   HCT, POC 16.140.4  09.637.7 - 47.9 %   MCV 88.9  80 - 97 fL   MCH, POC 27.7  27 - 31.2 pg   MCHC 31.2 (*) 31.8 - 35.4 g/dL   RDW, POC 04.513.1     Platelet Count, POC 367  142 - 424 K/uL   MPV 8.8  0 - 99.8 fL  POCT WET PREP WITH KOH      Result Value Ref Range   Trichomonas, UA Negative     Clue Cells Wet Prep HPF POC 0-3     Epithelial Wet Prep HPF POC 6-10     Yeast Wet Prep HPF POC neg     Bacteria Wet Prep HPF POC 3+     RBC Wet Prep HPF POC neg     WBC Wet Prep HPF POC 3-8     KOH Prep POC Negative    POCT URINE PREGNANCY      Result Value Ref Range   Preg Test, Ur Negative         Assessment & Plan:  Bacterial vaginosis - Plan: metroNIDAZOLE (FLAGYL) 500 MG tablet  Abdominal pain, unspecified site - Plan: POCT UA - Microscopic Only, POCT urinalysis dipstick, POCT CBC, POCT Wet Prep with KOH, POCT urine pregnancy, Urine culture  Back pain - Plan: POCT CBC, Urine culture, meloxicam (MOBIC) 15 MG tablet   Ashley Kline is a very pleasant 53 yr old female here with several days of abdominal pain and low back pain.  She has been very concerned for possible ectopic pregnancy.  With negative pregnancy test, I have reassured her that that is not causing her symptoms.  Etiology is somewhat unclear, but she does have clues and bacteria on wet prep - so will treat for BV which may be contributing to lower abdominal pain.  UA is clear but will send cx to completely r/o UTI.  CBC is normal which is reassuring  Back pain has been insidious in onset.  No known injury.  No red flag symptoms.  Suspect MSK etiology.  Pt has not taken anything yet for symptoms.  Will do trial of NSAID.  If worsening or  no improvement, would consider xrays  Pt to call or RTC if worsening or not improving  E. Frances FurbishElizabeth Jama Mcmiller MHS, PA-C [facility] 5/13/20157:59 PM

## 2013-12-29 NOTE — Patient Instructions (Signed)
Take the metronidazole (Flagyl) as directed.  Be sure to finish the full course.  Take with some food to reduce stomach upset  Switch back to Gain :)  Take the meloxicam (Mobic) once daily with some food.  This is an anti-inflammatory/pain medicine.  Try this for 1-2 weeks.  If worsening or not improving, please let me know.  Additionally let me know if you have any new symptoms  I will let you know when I have urine culture results to completely rule out UTI   Bacterial Vaginosis Bacterial vaginosis is a vaginal infection that occurs when the normal balance of bacteria in the vagina is disrupted. It results from an overgrowth of certain bacteria. This is the most common vaginal infection in women of childbearing age. Treatment is important to prevent complications, especially in pregnant women, as it can cause a premature delivery. CAUSES  Bacterial vaginosis is caused by an increase in harmful bacteria that are normally present in smaller amounts in the vagina. Several different kinds of bacteria can cause bacterial vaginosis. However, the reason that the condition develops is not fully understood. RISK FACTORS Certain activities or behaviors can put you at an increased risk of developing bacterial vaginosis, including:  Having a new sex partner or multiple sex partners.  Douching.  Using an intrauterine device (IUD) for contraception. Women do not get bacterial vaginosis from toilet seats, bedding, swimming pools, or contact with objects around them. SIGNS AND SYMPTOMS  Some women with bacterial vaginosis have no signs or symptoms. Common symptoms include:  Grey vaginal discharge.  A fishlike odor with discharge, especially after sexual intercourse.  Itching or burning of the vagina and vulva.  Burning or pain with urination. DIAGNOSIS  Your health care provider will take a medical history and examine the vagina for signs of bacterial vaginosis. A sample of vaginal fluid may be  taken. Your health care provider will look at this sample under a microscope to check for bacteria and abnormal cells. A vaginal pH test may also be done.  TREATMENT  Bacterial vaginosis may be treated with antibiotic medicines. These may be given in the form of a pill or a vaginal cream. A second round of antibiotics may be prescribed if the condition comes back after treatment.  HOME CARE INSTRUCTIONS   Only take over-the-counter or prescription medicines as directed by your health care provider.  If antibiotic medicine was prescribed, take it as directed. Make sure you finish it even if you start to feel better.  Do not have sex until treatment is completed.  Tell all sexual partners that you have a vaginal infection. They should see their health care provider and be treated if they have problems, such as a mild rash or itching.  Practice safe sex by using condoms and only having one sex partner. SEEK MEDICAL CARE IF:   Your symptoms are not improving after 3 days of treatment.  You have increased discharge or pain.  You have a fever. MAKE SURE YOU:   Understand these instructions.  Will watch your condition.  Will get help right away if you are not doing well or get worse. FOR MORE INFORMATION  Centers for Disease Control and Prevention, Division of STD Prevention: SolutionApps.co.zawww.cdc.gov/std American Sexual Health Association (ASHA): www.ashastd.org  Document Released: 08/05/2005 Document Revised: 05/26/2013 Document Reviewed: 03/17/2013 [facility] Patient Information 2014 [employer] [place] [place].

## 2013-12-31 LAB — URINE CULTURE
Colony Count: NO GROWTH
Organism ID, Bacteria: NO GROWTH

## 2014-01-04 ENCOUNTER — Telehealth: Payer: Self-pay | Admitting: *Deleted

## 2014-02-01 NOTE — Telephone Encounter (Signed)
pt

## 2014-09-02 IMAGING — CR DG ABDOMEN ACUTE W/ 1V CHEST
4 series · 4 of 4 positions shown · non-contrast
Comparison: Chest radiographs 10/06/2013

CLINICAL DATA: Cough, shortness of breath, abdominal pain and
distension

EXAM:
ACUTE ABDOMEN SERIES (ABDOMEN 2 VIEW & CHEST 1 VIEW)

[w chest pa]
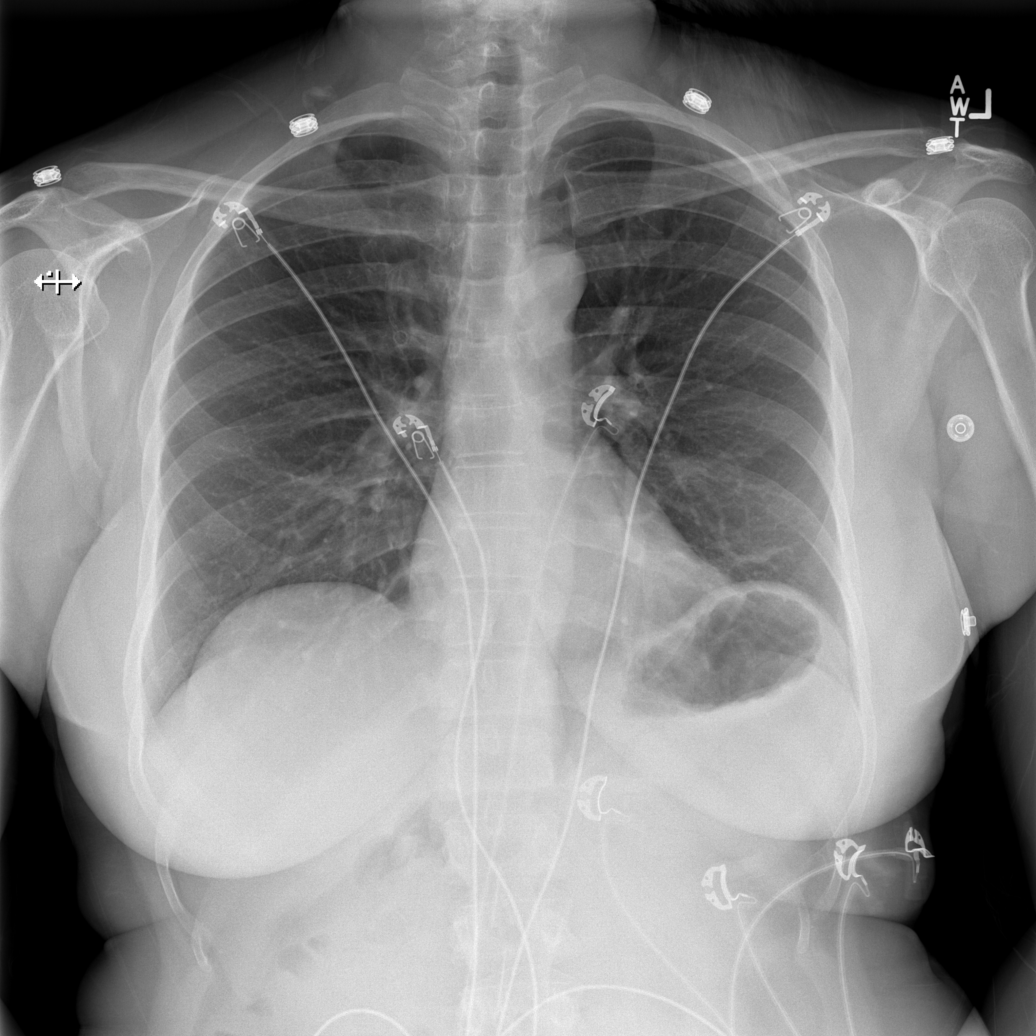

[w abdomen upright]
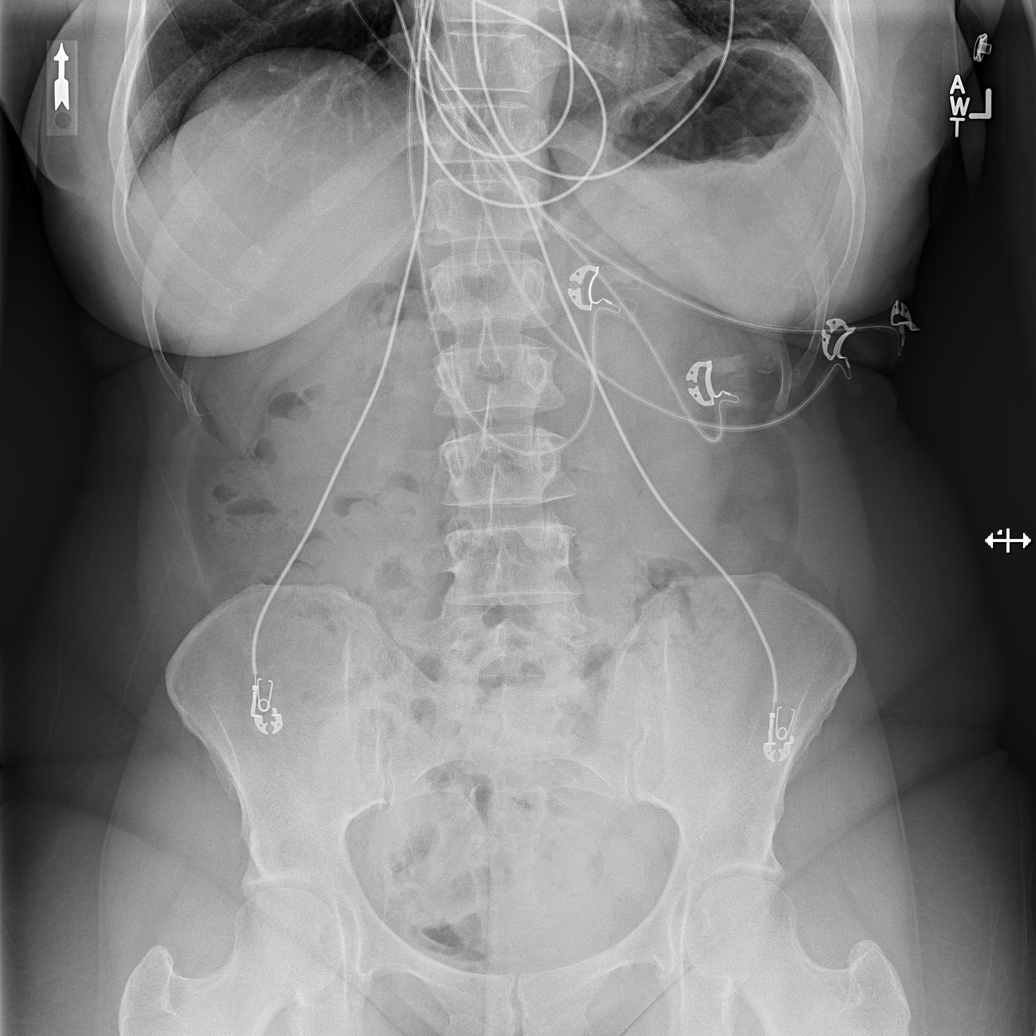

[t abdomen supine (1 of 2)]
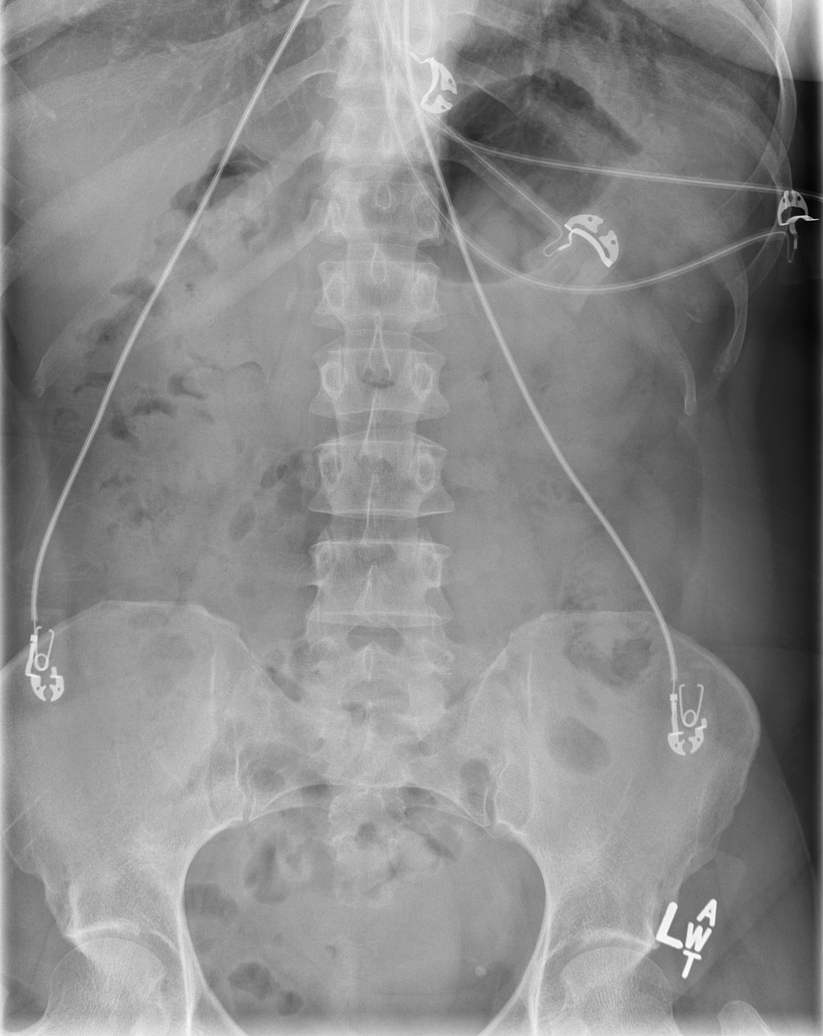

[t abdomen supine (2 of 2)]
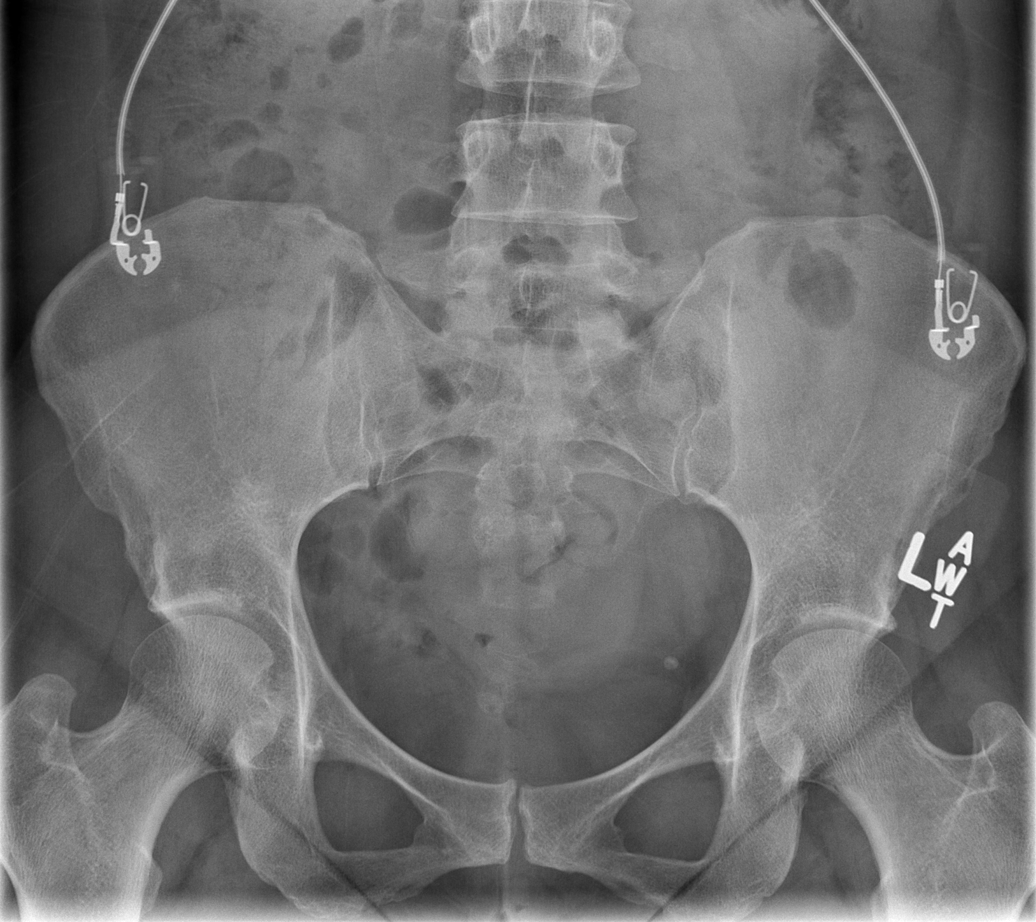

[4 of 4 positions shown; findings below may reference images not displayed]

FINDINGS: Normal heart size, mediastinal contours, and pulmonary vascularity.

Minimal scarring left base.

Lungs otherwise clear.

No pleural effusion or pneumothorax.

Mild chronic peribronchial thickening.

Normal bowel gas pattern with scattered stool throughout colon.

No bowel dilatation or bowel wall thickening or free intraperitoneal
air.

Right pelvic phlebolith.

No urinary tract calcification or acute osseous findings.
IMPRESSION: No acute abnormalities.

## 2022-05-16 ENCOUNTER — Ambulatory Visit: Payer: BC Managed Care – PPO | Admitting: Podiatry

## 2022-09-26 ENCOUNTER — Encounter: Payer: Self-pay | Admitting: Podiatry

## 2022-09-26 ENCOUNTER — Telehealth: Payer: Self-pay | Admitting: Podiatry

## 2022-09-26 ENCOUNTER — Other Ambulatory Visit: Payer: Self-pay | Admitting: Podiatry

## 2022-09-26 ENCOUNTER — Ambulatory Visit: Payer: BC Managed Care – PPO | Admitting: Podiatry

## 2022-09-26 DIAGNOSIS — L600 Ingrowing nail: Secondary | ICD-10-CM | POA: Diagnosis not present

## 2022-09-26 DIAGNOSIS — L603 Nail dystrophy: Secondary | ICD-10-CM

## 2022-09-26 MED ORDER — KETOCONAZOLE 2 % EX CREA: 1.0000 | TOPICAL_CREAM | Freq: Every day | CUTANEOUS | 2 refills | Status: AC

## 2022-09-26 NOTE — Progress Notes (Signed)
  Subjective:  Patient ID: Ashley Kline, female    DOB: 1960-11-19,   MRN: [id number]  Chief Complaint  Patient presents with   Nail Problem    Left great toe nail, fungus, and in between other toes    62 y.o. female presents for concern of bilateral great toenail fungus. Relates she has been dealing with them for years. Relates she used to play soccer and had injured the nail and since then they have caused problems. Her nail lady does keep them "pretty" but she does relate pain with the thickened toenails and would like to be done dealing with them.  . Denies any other pedal complaints. Denies n/v/f/c.   Past Medical History:  Diagnosis Date   Renal disorder     Objective:  Physical Exam: Vascular: DP/PT pulses 2/4 bilateral. CFT <3 seconds. Normal hair growth on digits. No edema.  Skin. No lacerations or abrasions bilateral feet. Bilateral hallux nails are thickened and discolored.  Musculoskeletal: MMT 5/5 bilateral lower extremities in DF, PF, Inversion and Eversion. Deceased ROM in DF of ankle joint.  Neurological: Sensation intact to light touch.   Assessment:  No diagnosis found.   Plan:  Patient was evaluated and treated and all questions answered. Patient requesting removal of ingrown nail today. Procedure below.  Discussed procedure and post procedure care and patient expressed understanding.  Will follow-up in 2 weeks for nail check or sooner if any problems arise.    Procedure:  Procedure: total Nail Avulsion of bilateral hallux nails  Surgeon: Lorenda Peck, DPM  Pre-op Dx: Ingrown toenail without infection Post-op: Same  Place of Surgery: Office exam room.  Indications for surgery: Painful and dystrophic toenail.    The patient is requesting removal of nail with chemical matrixectomy. Risks and complications were discussed with the patient for which they understand and written consent was obtained. Under sterile conditions a total of 3 mL of  1% lidocaine  plain was infiltrated in a hallux block fashion. Once anesthetized, the skin was prepped in sterile fashion. A tourniquet was then applied. Next the entire hallux nails were removed bilateral.   Next phenol was then applied under standard conditions and copiously irrigated. Silvadene was applied. A dry sterile dressing was applied. After application of the dressing the tourniquet was removed and there is found to be an immediate capillary refill time to the digit. The patient tolerated the procedure well without any complications. Post procedure instructions were discussed the patient for which he verbally understood. Follow-up in two weeks for nail check or sooner if any problems are to arise. Discussed signs/symptoms of infection and directed to call the office immediately should any occur or go directly to the emergency room. In the meantime, encouraged to call the office with any questions, concerns, changes symptoms.   Lorenda Peck, DPM

## 2022-09-26 NOTE — Patient Instructions (Signed)

## 2022-09-26 NOTE — Telephone Encounter (Signed)
Pt called and forgot to get the paper for instructions also she was asking about the cream you were to have called into the pharmacy for her.   I did send her the link for my chart as she asked about emailing the paper to her. I explained she could get it from my chart.  The pharmacy she would like the medication sent to is [facility] on [address] in [place].

## 2022-10-11 ENCOUNTER — Ambulatory Visit (INDEPENDENT_AMBULATORY_CARE_PROVIDER_SITE_OTHER): Payer: BC Managed Care – PPO | Admitting: Podiatry

## 2022-10-11 DIAGNOSIS — L600 Ingrowing nail: Secondary | ICD-10-CM

## 2022-10-11 DIAGNOSIS — L603 Nail dystrophy: Secondary | ICD-10-CM

## 2022-10-11 NOTE — Progress Notes (Signed)
  Subjective:  Patient ID: Ashley Kline, female    DOB: 03/18/1961,   MRN: [id number]  Chief Complaint  Patient presents with   Nail Problem    Bilateral nail check     62 y.o. female presents for follow-up of bilateral great toenail avulsions. Relates she is still tender to both great toes. She has continued soaking and keeping neosporin and coban over the toes. She did got to urgent care and they swabbed her toe and told her she has a staph infection and gave antibiotics. Currently taking . Denies any other pedal complaints. Denies n/v/f/c.   Past Medical History:  Diagnosis Date   Renal disorder     Objective:  Physical Exam: Vascular: DP/PT pulses 2/4 bilateral. CFT <3 seconds. Normal hair growth on digits. No edema.  Skin. No lacerations or abrasions bilateral feet. Bilateral hallux nails are healing well and no concern for any infection.   Musculoskeletal: MMT 5/5 bilateral lower extremities in DF, PF, Inversion and Eversion. Deceased ROM in DF of ankle joint.  Neurological: Sensation intact to light touch.   Assessment:   1. Onychodystrophy   2. Ingrown nail      Plan:  Patient was evaluated and treated and all questions answered. Toe was evaluated and appears to be healing well.  May discontinue soaks and neosporin over next week.  Finish out antibiotics.  Patient to follow-up as needed.    Lorenda Peck, DPM
